# Patient Record
Sex: Female | Born: 1945 | Race: White | Hispanic: No | Marital: Single | Smoking: Never smoker
Health system: Southern US, Community
[De-identification: ages and names within clinical notes are randomized; demographics above are authoritative.]

## PROBLEM LIST (undated history)

## (undated) DIAGNOSIS — L308 Other specified dermatitis: Secondary | ICD-10-CM

## (undated) DIAGNOSIS — I6523 Occlusion and stenosis of bilateral carotid arteries: Secondary | ICD-10-CM

## (undated) DIAGNOSIS — E119 Type 2 diabetes mellitus without complications: Secondary | ICD-10-CM

## (undated) DIAGNOSIS — E7800 Pure hypercholesterolemia, unspecified: Secondary | ICD-10-CM

## (undated) DIAGNOSIS — R55 Syncope and collapse: Secondary | ICD-10-CM

## (undated) DIAGNOSIS — I10 Essential (primary) hypertension: Secondary | ICD-10-CM

## (undated) DIAGNOSIS — F419 Anxiety disorder, unspecified: Secondary | ICD-10-CM

## (undated) DIAGNOSIS — M810 Age-related osteoporosis without current pathological fracture: Secondary | ICD-10-CM

## (undated) DIAGNOSIS — L409 Psoriasis, unspecified: Secondary | ICD-10-CM

## (undated) DIAGNOSIS — M1990 Unspecified osteoarthritis, unspecified site: Secondary | ICD-10-CM

## (undated) DIAGNOSIS — I5189 Other ill-defined heart diseases: Secondary | ICD-10-CM

## (undated) DIAGNOSIS — M0579 Rheumatoid arthritis with rheumatoid factor of multiple sites without organ or systems involvement: Secondary | ICD-10-CM

## (undated) HISTORY — PX: FOOT SURGERY: SHX648

---

## 2004-02-06 ENCOUNTER — Encounter: Payer: Self-pay | Admitting: Family Medicine

## 2004-03-03 ENCOUNTER — Encounter: Payer: Self-pay | Admitting: Family Medicine

## 2004-04-03 ENCOUNTER — Encounter: Payer: Self-pay | Admitting: Family Medicine

## 2004-05-03 ENCOUNTER — Encounter: Payer: Self-pay | Admitting: Family Medicine

## 2004-06-03 ENCOUNTER — Encounter: Payer: Self-pay | Admitting: Family Medicine

## 2004-07-03 ENCOUNTER — Encounter: Payer: Self-pay | Admitting: Family Medicine

## 2004-08-03 ENCOUNTER — Encounter: Payer: Self-pay | Admitting: Family Medicine

## 2004-09-03 ENCOUNTER — Encounter: Payer: Self-pay | Admitting: Family Medicine

## 2004-10-03 ENCOUNTER — Encounter: Payer: Self-pay | Admitting: Family Medicine

## 2004-11-03 ENCOUNTER — Encounter: Payer: Self-pay | Admitting: Family Medicine

## 2004-12-03 ENCOUNTER — Encounter: Payer: Self-pay | Admitting: Family Medicine

## 2005-01-03 ENCOUNTER — Encounter: Payer: Self-pay | Admitting: Family Medicine

## 2005-02-03 ENCOUNTER — Encounter: Payer: Self-pay | Admitting: Family Medicine

## 2005-03-03 ENCOUNTER — Encounter: Payer: Self-pay | Admitting: Family Medicine

## 2005-04-03 ENCOUNTER — Encounter: Payer: Self-pay | Admitting: Family Medicine

## 2005-05-03 ENCOUNTER — Encounter: Payer: Self-pay | Admitting: Family Medicine

## 2006-05-15 ENCOUNTER — Ambulatory Visit: Payer: Self-pay | Admitting: Family Medicine

## 2006-05-24 ENCOUNTER — Ambulatory Visit: Payer: Self-pay | Admitting: Gastroenterology

## 2007-01-20 ENCOUNTER — Ambulatory Visit: Payer: Self-pay | Admitting: Internal Medicine

## 2007-04-29 ENCOUNTER — Ambulatory Visit: Payer: Self-pay | Admitting: Family Medicine

## 2007-04-29 ENCOUNTER — Other Ambulatory Visit: Payer: Self-pay

## 2007-04-29 ENCOUNTER — Observation Stay: Payer: Self-pay | Admitting: Internal Medicine

## 2008-10-09 ENCOUNTER — Ambulatory Visit: Payer: Self-pay | Admitting: Family Medicine

## 2010-03-11 ENCOUNTER — Ambulatory Visit: Payer: Self-pay | Admitting: Family Medicine

## 2010-11-12 ENCOUNTER — Ambulatory Visit: Payer: Self-pay

## 2011-11-29 ENCOUNTER — Ambulatory Visit: Payer: Self-pay

## 2011-12-12 ENCOUNTER — Ambulatory Visit: Payer: Self-pay | Admitting: Family Medicine

## 2012-01-03 ENCOUNTER — Ambulatory Visit: Payer: Self-pay | Admitting: Surgery

## 2012-01-03 HISTORY — PX: BREAST BIOPSY: SHX20

## 2012-01-05 LAB — PATHOLOGY REPORT

## 2012-01-10 ENCOUNTER — Ambulatory Visit: Payer: Self-pay | Admitting: Family Medicine

## 2012-08-01 ENCOUNTER — Ambulatory Visit: Payer: Self-pay | Admitting: Family Medicine

## 2013-07-19 IMAGING — MG MM ADDITIONAL VIEWS AT NO CHARGE
1 series · 3 of 3 positions shown · non-contrast
Comparison: none

REASON FOR EXAM: av lt asymmetric density
COMMENTS:

[L ML · left · 3 of 3 slices shown]
[im 1/3]
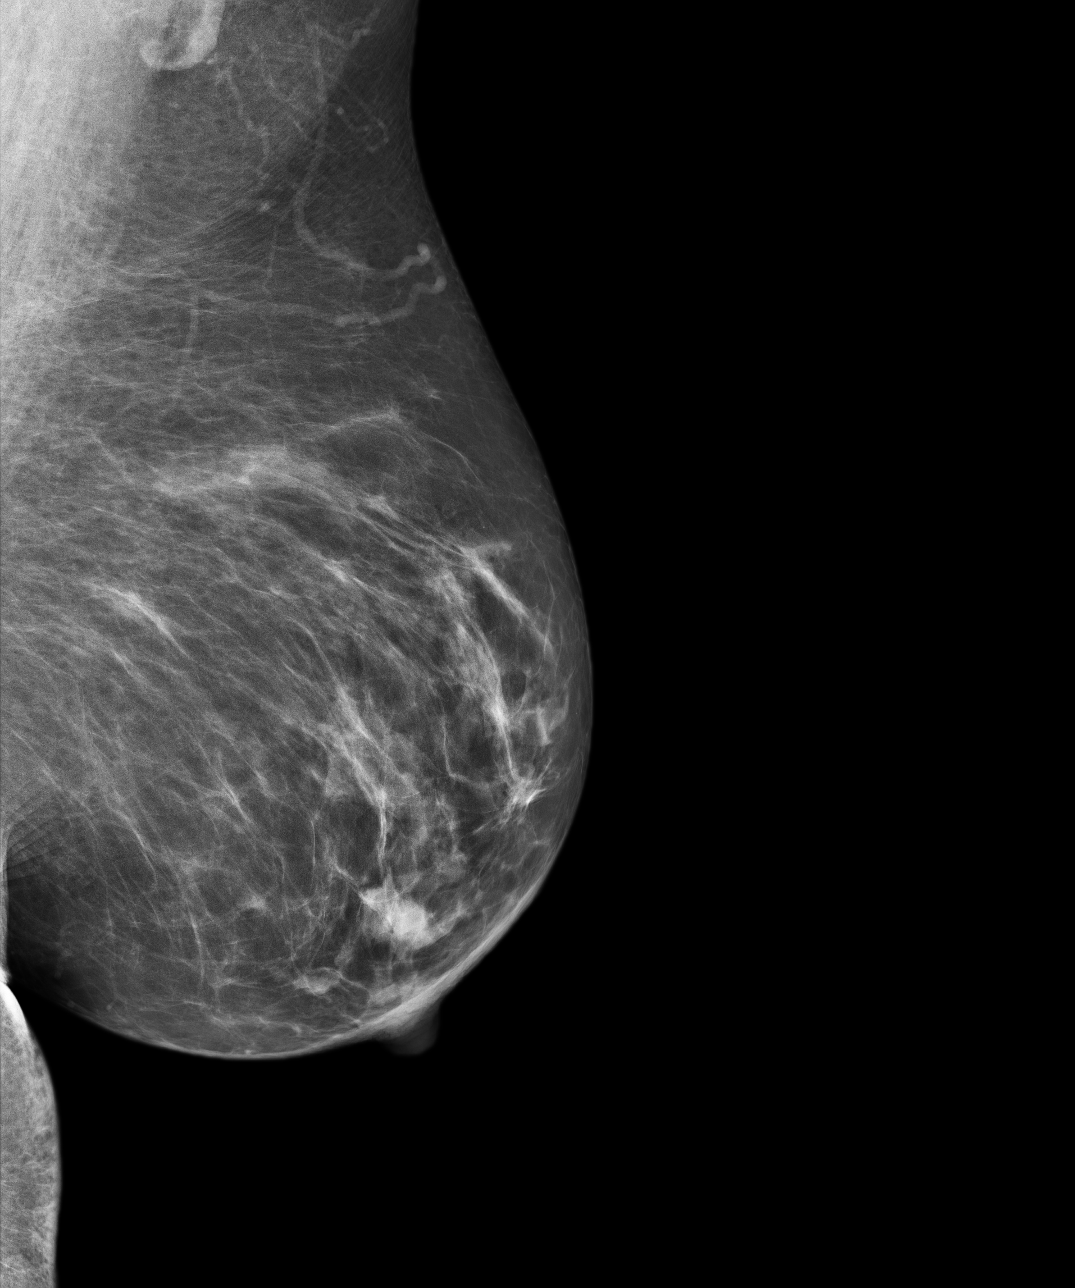
[im 2/3]
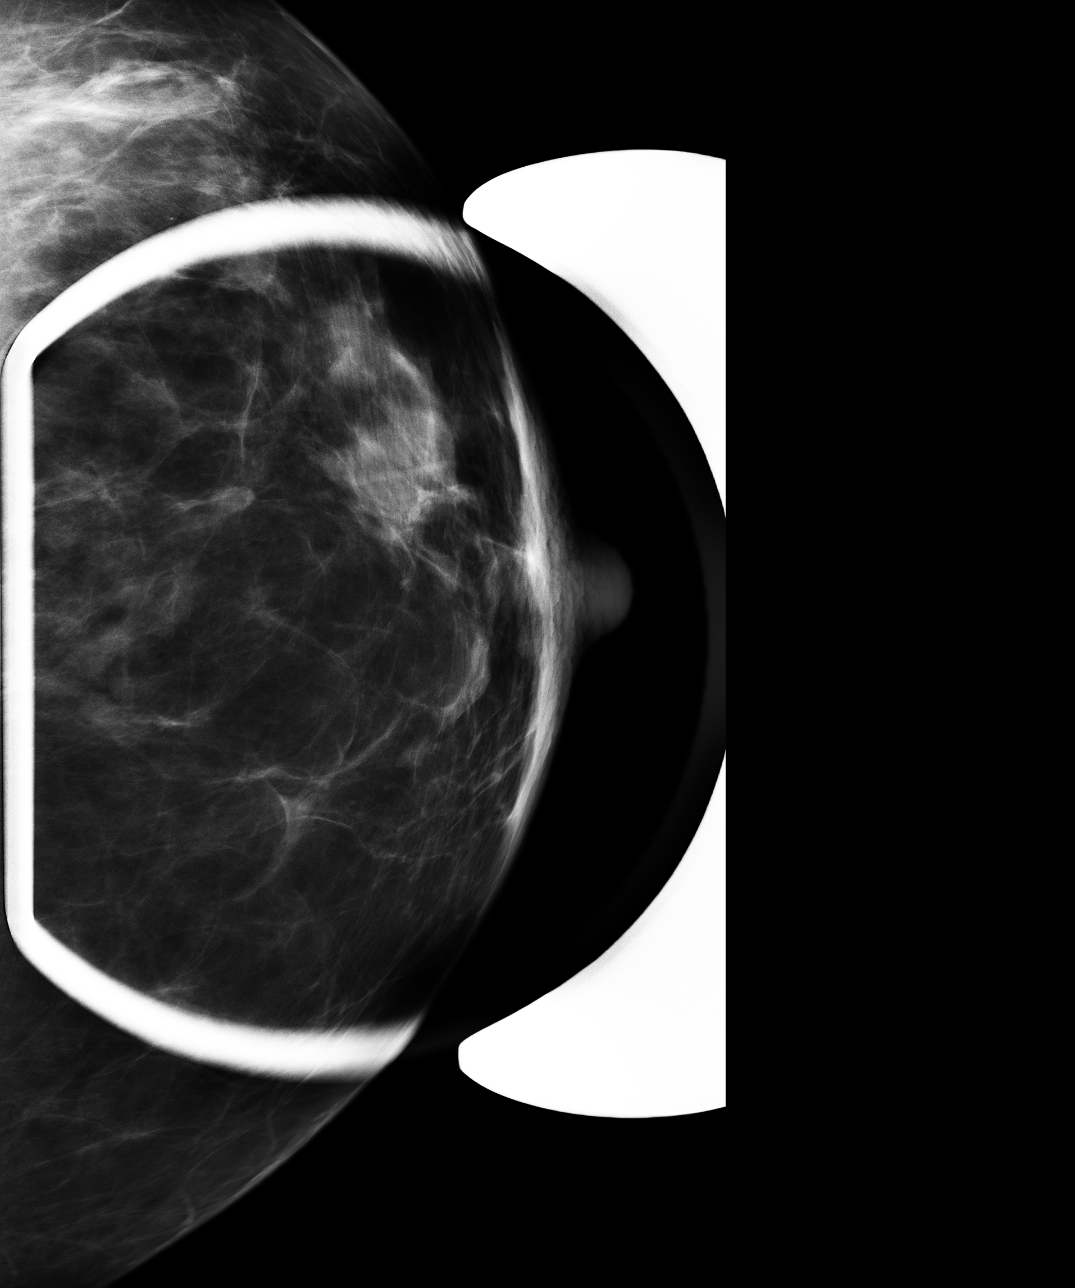
[im 3/3]
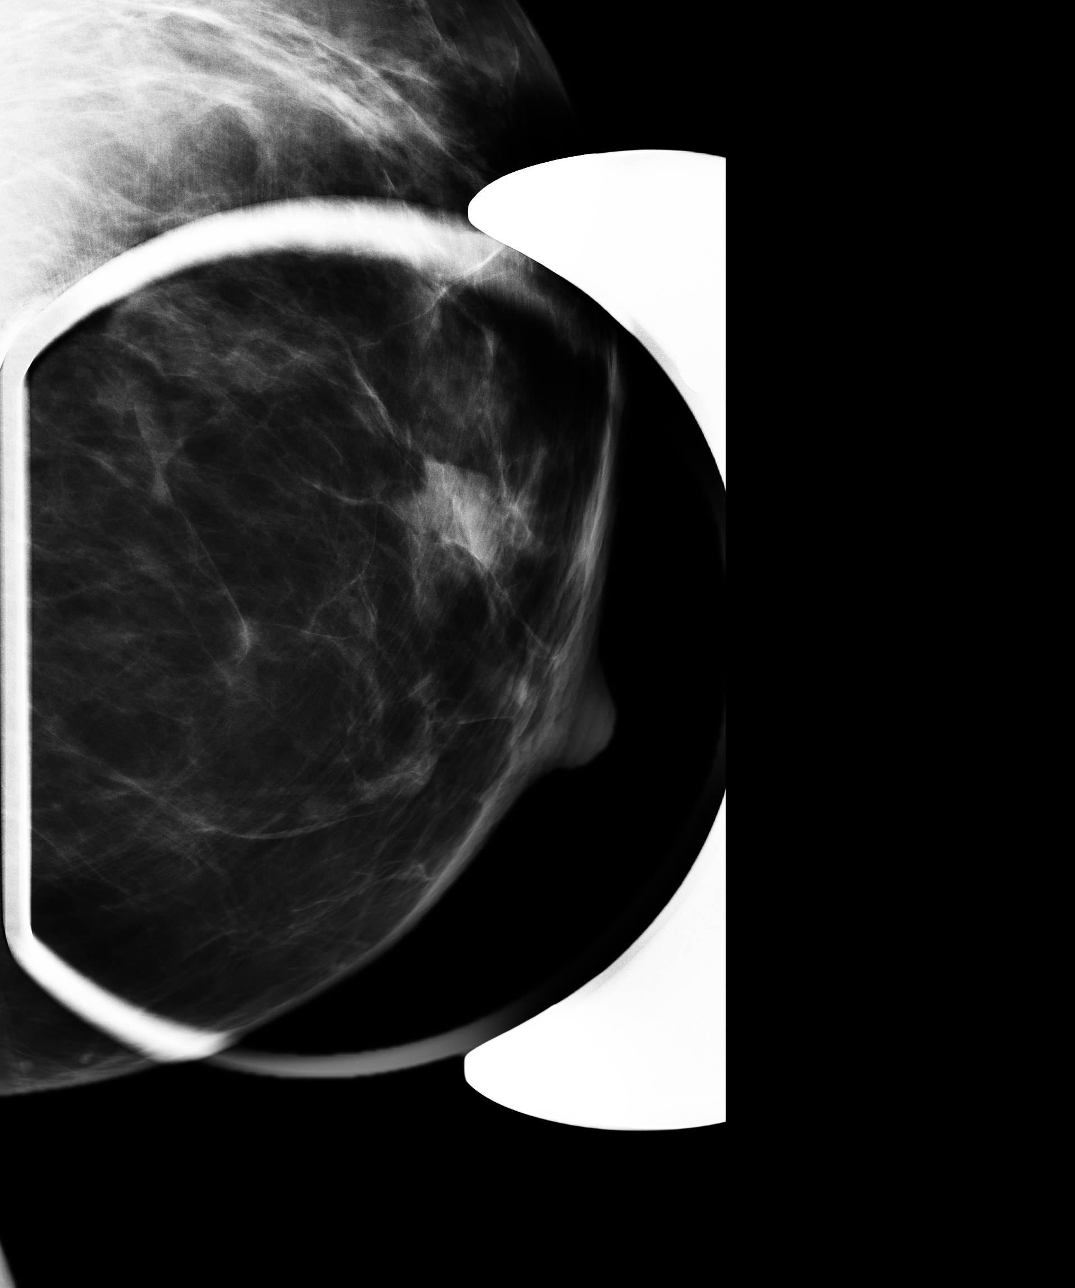

[3 of 3 positions shown; findings below may reference images not displayed]

PROCEDURE:     MAM - MAM DGTL ADD VW LT  SCR  - December 12, 2011  [DATE]

RESULT:

The previously described area of asymmetric density within the left breast
was further evaluated with magnification compression imaging. This area
demonstrates a component of effacement with compression though residual
density is appreciated. Sonographic evaluation of the periareolar region
demonstrates an oval-shaped indeterminate nodule at the [DATE] position
measuring 0.93 x 0.42 x 0.58 cm. This appears to correlate with a more
amorphous appearing nodule in the retroareolar area on the mammogram.
Further evaluation with surgical consultation is recommended. There is no
further radiographic evidence to suggest malignancy.
IMPRESSION: BI-RADS: Category 4 - Suspicious Abnormality

Thank you for this opportunity to contribute to the care of your patient.

A NEGATIVE MAMMOGRAM REPORT DOES NOT PRECLUDE BIOPSY OR OTHER EVALUATION OF
A CLINICALLY PALPABLE OR OTHERWISE SUSPICIOUS MASS OR LESION. BREAST CANCER
MAY NOT BE DETECTED BY MAMMOGRAPHY IN UP TO 10% OF CASES.

## 2013-10-10 ENCOUNTER — Ambulatory Visit: Payer: Self-pay | Admitting: Family Medicine

## 2014-12-10 ENCOUNTER — Other Ambulatory Visit: Payer: Self-pay | Admitting: Student

## 2014-12-10 DIAGNOSIS — M810 Age-related osteoporosis without current pathological fracture: Secondary | ICD-10-CM

## 2014-12-10 DIAGNOSIS — Z1239 Encounter for other screening for malignant neoplasm of breast: Secondary | ICD-10-CM

## 2015-02-08 ENCOUNTER — Emergency Department
Admission: EM | Admit: 2015-02-08 | Discharge: 2015-02-08 | Disposition: A | Discharge status: Home / Self Care | Payer: Medicare Other | Attending: Emergency Medicine | Admitting: Emergency Medicine

## 2015-02-08 ENCOUNTER — Encounter: Payer: Self-pay | Admitting: Emergency Medicine

## 2015-02-08 ENCOUNTER — Emergency Department: Payer: Medicare Other

## 2015-02-08 DIAGNOSIS — E860 Dehydration: Secondary | ICD-10-CM | POA: Diagnosis not present

## 2015-02-08 DIAGNOSIS — E119 Type 2 diabetes mellitus without complications: Secondary | ICD-10-CM | POA: Insufficient documentation

## 2015-02-08 DIAGNOSIS — R55 Syncope and collapse: Secondary | ICD-10-CM | POA: Diagnosis not present

## 2015-02-08 DIAGNOSIS — J069 Acute upper respiratory infection, unspecified: Secondary | ICD-10-CM | POA: Insufficient documentation

## 2015-02-08 DIAGNOSIS — I10 Essential (primary) hypertension: Secondary | ICD-10-CM | POA: Insufficient documentation

## 2015-02-08 HISTORY — DX: Type 2 diabetes mellitus without complications: E11.9

## 2015-02-08 HISTORY — DX: Essential (primary) hypertension: I10

## 2015-02-08 HISTORY — DX: Anxiety disorder, unspecified: F41.9

## 2015-02-08 LAB — BASIC METABOLIC PANEL
ANION GAP: 9 (ref 5–15)
BUN: 20 mg/dL (ref 6–20)
CO2: 28 mmol/L (ref 22–32)
Calcium: 8.6 mg/dL — ABNORMAL LOW (ref 8.9–10.3)
Chloride: 95 mmol/L — ABNORMAL LOW (ref 101–111)
Creatinine, Ser: 0.57 mg/dL (ref 0.44–1.00)
Glucose, Bld: 189 mg/dL — ABNORMAL HIGH (ref 65–99)
POTASSIUM: 3.3 mmol/L — AB (ref 3.5–5.1)
SODIUM: 132 mmol/L — AB (ref 135–145)

## 2015-02-08 LAB — URINALYSIS COMPLETE WITH MICROSCOPIC (ARMC ONLY)
Bacteria, UA: NONE SEEN
Bilirubin Urine: NEGATIVE
GLUCOSE, UA: NEGATIVE mg/dL
HGB URINE DIPSTICK: NEGATIVE
KETONES UR: NEGATIVE mg/dL
LEUKOCYTES UA: NEGATIVE
NITRITE: NEGATIVE
Protein, ur: NEGATIVE mg/dL
SPECIFIC GRAVITY, URINE: 1.014 (ref 1.005–1.030)
pH: 7 (ref 5.0–8.0)

## 2015-02-08 LAB — CBC
HEMATOCRIT: 42.1 % (ref 35.0–47.0)
Hemoglobin: 14.4 g/dL (ref 12.0–16.0)
MCH: 29.8 pg (ref 26.0–34.0)
MCHC: 34.2 g/dL (ref 32.0–36.0)
MCV: 87.1 fL (ref 80.0–100.0)
PLATELETS: 241 10*3/uL (ref 150–440)
RBC: 4.83 MIL/uL (ref 3.80–5.20)
RDW: 13.7 % (ref 11.5–14.5)
WBC: 8.3 10*3/uL (ref 3.6–11.0)

## 2015-02-08 MED ORDER — ONDANSETRON 4 MG PO TBDP: 4.0000 mg | ORAL_TABLET | Freq: Three times a day (TID) | ORAL | Status: DC | PRN

## 2015-02-08 MED ORDER — POTASSIUM CHLORIDE CRYS ER 20 MEQ PO TBCR
40.0000 meq | EXTENDED_RELEASE_TABLET | Freq: Once | ORAL | Status: AC
  Administered 2015-02-08: 40 meq via ORAL
  Filled 2015-02-08: qty 2

## 2015-02-08 MED ORDER — SODIUM CHLORIDE 0.9 % IV BOLUS (SEPSIS)
1000.0000 mL | Freq: Once | INTRAVENOUS | Status: AC
  Administered 2015-02-08: 1000 mL via INTRAVENOUS

## 2015-02-08 NOTE — ED Provider Notes (Signed)
[facility] Emergency Department Provider Note  ____________________________________________  Time seen: 2:00 PM  I have reviewed the triage vital signs and the nursing notes.   HISTORY  Chief Complaint Loss of Consciousness    HPI Kerry Jordan is a 70 y.o. female who complains of syncope today patient reports that she has been having a upper respiratory infection for the past 2 or 3 days with sore throat rhinorrhea and nonproductive cough and severe fatigue. She states that she has been staying mostly in bed for the past 3 days due to the fatigue, which is unlike her. Denies any fevers vomiting nausea or diarrhea. No pain anywhere at all.  Today, despite not feeling any better and generally having to stay in bed, she went to church. After sitting upright for a while she notes that she felt very hot and thought that she was doing lightheaded and going to pass out so [place] [facility] where she became very lightheaded and lowered herself to the ground for briefly passing out. She denies any pain before or after, no headache vision changes numbness [place] weakness chest pain abdominal pain or back pain. No shortness of breath. She did scratch her nose with her fingernail but otherwise denies any trauma.  She reports poor oral intake over the last 2-3 days due to loss of appetite. Denies nausea or vomiting   Past Medical History  Diagnosis Date  . Diabetes mellitus without complication ([place])   . Hypertension   . Anxiety      There are no active problems to display for this patient.    Past Surgical History  Procedure Laterality Date  . Foot surgery       Current Outpatient Rx  Name  Route  Sig  Dispense  Refill  . ondansetron (ZOFRAN ODT) 4 MG disintegrating tablet   Oral   Take 1 tablet (4 mg total) by mouth every 8 (eight) hours as needed for nausea or vomiting.   20 tablet   0      Allergies Sulfa antibiotics   No family  history on file.  Social History Social History  Substance Use Topics  . Smoking status: Never Smoker   . Smokeless tobacco: None  . Alcohol Use: Yes     Comment: occasional    Review of Systems  Constitutional:   No fever or chills. No weight changes Eyes:   No blurry vision or double vision.  ENT:   Positive sore throat. Cardiovascular:   No chest pain. Respiratory:   No dyspnea positive nonproductive cough. Gastrointestinal:   Negative for abdominal pain, vomiting and diarrhea.  No BRBPR or melena. Genitourinary:   Negative for dysuria, urinary retention, bloody urine, or difficulty urinating. Musculoskeletal:   Negative for back pain. No joint swelling or pain. Skin:   Negative for rash. Neurological:   Negative for headaches, focal weakness or numbness. Psychiatric:  No anxiety or depression.   Endocrine:  No hot/cold intolerance, changes in energy, or sleep difficulty.  10-point ROS otherwise negative.  ____________________________________________   PHYSICAL EXAM:  VITAL SIGNS: ED Triage Vitals  Enc Vitals Group     BP 02/08/15 1400 147/80 mmHg     Pulse Rate 02/08/15 1415 71     Resp --      Temp --      Temp src --      SpO2 02/08/15 1415 95 %     Weight --      Height --  Head Cir --      Peak Flow --      Pain Score --      Pain Loc --      Pain Edu? --      Excl. in [place]? --     Vital signs reviewed, nursing assessments reviewed.   Constitutional:   Alert and oriented. Well appearing and in no distress. Eyes:   No scleral icterus. No conjunctival pallor. PERRL. EOMI ENT   Head:   Normocephalic and atraumatic.   Nose:   No congestion/rhinnorhea. No septal hematoma   Mouth/Throat:   Dry mucous membranes, no pharyngeal erythema. No peritonsillar mass. No uvula shift.   Neck:   No stridor. No SubQ emphysema. No meningismus. Hematological/Lymphatic/Immunilogical:   No cervical lymphadenopathy. Cardiovascular:   RRR. Normal and  symmetric distal pulses are present in all extremities. No murmurs, rubs, or gallops. Respiratory:   Normal respiratory effort without tachypnea nor retractions. Breath sounds are clear and equal bilaterally. No wheezes/rales/rhonchi. Gastrointestinal:   Soft and nontender. No distention. There is no CVA tenderness.  No rebound, rigidity, or guarding. Genitourinary:   deferred Musculoskeletal:   Nontender with normal range of motion in all extremities. No joint effusions.  No lower extremity tenderness.  No edema. Neurologic:   Normal speech and language.  CN 2-10 normal. Motor grossly intact. No pronator drift.  Normal gait. No gross focal neurologic deficits are appreciated.  Skin:    Skin is warm, dry and intact. No rash noted.  No petechiae, purpura, or bullae. Psychiatric:   Mood and affect are normal. Speech and behavior are normal. Patient exhibits appropriate insight and judgment.  ____________________________________________    LABS (pertinent positives/negatives) (all labs ordered are listed, but only abnormal results are displayed) Labs Reviewed  BASIC METABOLIC PANEL - Abnormal; Notable for the following:    Sodium 132 (*)    Potassium 3.3 (*)    Chloride 95 (*)    Glucose, Bld 189 (*)    Calcium 8.6 (*)    All other components within normal limits  CBC  URINALYSIS COMPLETEWITH MICROSCOPIC ([facility] ONLY)  CBG MONITORING, ED   ____________________________________________   EKG  Interpreted by me Normal sinus rhythm rate of 68, normal axis and intervals. Normal QRS. Normal ST segments. T wave inversions in V2 and V3. No recent EKG available for comparison  ____________________________________________    RADIOLOGY  Chest x-ray interpreted by me, radiology report reviewed, essentially unremarkable. No acute pathology or airspace disease  ____________________________________________   PROCEDURES   ____________________________________________   INITIAL  IMPRESSION / ASSESSMENT AND PLAN / ED COURSE  Pertinent labs & imaging results that were available during my care of the patient were reviewed by me and considered in my medical decision making (see chart for details).  Patient presents with syncope which appears to be related to dehydration and acute viral illness. She is overall well-appearing no acute distress, comfortable and pleasant to interact with. We'll rehydrate her with IV fluids will checking some labs and a chest x-ray.  ----------------------------------------- 3:01 PM on 02/08/2015 -----------------------------------------  Workup unremarkable. Low suspicion for pneumonia sepsis or vascular phenomenon. We'll give Zofran when necessary. Potassium in the emergency department. Follow-up with primary care.     ____________________________________________   FINAL CLINICAL IMPRESSION(S) / ED DIAGNOSES  Final diagnoses:  Acute upper respiratory infection  Dehydration  Syncope, unspecified syncope type      Carrie Mew, MD 02/08/15 [phone]

## 2015-02-08 NOTE — ED Notes (Addendum)
Pt comes into the ED via EMS c/o syncopal episode that occurred at church this morning.  Patient has been fighting a head cold for a couple of days prior to this.  Denies hitting her head, NSR on monitor, 154 CBG, 80 HR, 170/100, no orthostatic changes per EMS.

## 2015-02-08 NOTE — Discharge Instructions (Signed)
Dehydration, Adult Dehydration is a condition in which you do not have enough fluid or water in your body. It happens when you take in less fluid than you lose. Vital organs such as the kidneys, brain, and heart cannot function without a proper amount of fluids. Any loss of fluids from the body can cause dehydration.  Dehydration can range from mild to severe. This condition should be treated right away to help prevent it from becoming severe. CAUSES  This condition may be caused by:  Vomiting.  Diarrhea.  Excessive sweating, such as when exercising in hot or humid weather.  Not drinking enough fluid during strenuous exercise or during an illness.  Excessive urine output.  Fever.  Certain medicines. RISK FACTORS This condition is more likely to develop in:  People who are taking certain medicines that cause the body to lose excess fluid (diuretics).   People who have a chronic illness, such as diabetes, that may increase urination.  Older adults.   People who live at high altitudes.   People who participate in endurance sports.  SYMPTOMS  Mild Dehydration  Thirst.  Dry lips.  Slightly dry mouth.  Dry, warm skin. Moderate Dehydration  Very dry mouth.   Muscle cramps.   Dark urine and decreased urine production.   Decreased tear production.   Headache.   Light-headedness, especially when you stand up from a sitting position.  Severe Dehydration  Changes in skin.   Cold and clammy skin.   Skin does not spring back quickly when lightly pinched and released.   Changes in body fluids.   Extreme thirst.   No tears.   Not able to sweat when body temperature is high, such as in hot weather.   Minimal urine production.   Changes in vital signs.   Rapid, weak pulse (more than 100 beats per minute when you are sitting still).   Rapid breathing.   Low blood pressure.   Other changes.   Sunken eyes.   Cold hands and feet.    Confusion.  Lethargy and difficulty being awakened.  Fainting (syncope).   Short-term weight loss.   Unconsciousness. DIAGNOSIS  This condition may be diagnosed based on your symptoms. You may also have tests to determine how severe your dehydration is. These tests may include:   Urine tests.   Blood tests.  TREATMENT  Treatment for this condition depends on the severity. Mild or moderate dehydration can often be treated at home. Treatment should be started right away. Do not wait until dehydration becomes severe. Severe dehydration needs to be treated at the hospital. Treatment for Mild Dehydration  Drinking plenty of water to replace the fluid you have lost.   Replacing minerals in your blood (electrolytes) that you may have lost.  Treatment for Moderate Dehydration  Consuming oral rehydration solution (ORS). Treatment for Severe Dehydration  Receiving fluid through an IV tube.   Receiving electrolyte solution through a feeding tube that is passed through your nose and into your stomach (nasogastric tube or NG tube).  Correcting any abnormalities in electrolytes. HOME CARE INSTRUCTIONS   Drink enough fluid to keep your urine clear or pale yellow.   Drink water or fluid slowly by taking small sips. You can also try sucking on ice cubes.  Have food or beverages that contain electrolytes. Examples include bananas and sports drinks.  Take over-the-counter and prescription medicines only as told by your health care provider.   Prepare ORS according to the manufacturer's instructions. Take sips  of ORS every 5 minutes until your urine returns to normal.  If you have vomiting or diarrhea, continue to try to drink water, ORS, or both.   If you have diarrhea, avoid:   Beverages that contain caffeine.   Fruit juice.   Milk.   Carbonated soft drinks.  Do not take salt tablets. This can lead to the condition of having too much sodium in your body  (hypernatremia).  SEEK MEDICAL CARE IF:  You cannot eat or drink without vomiting.  You have had moderate diarrhea during a period of more than 24 hours.  You have a fever. SEEK IMMEDIATE MEDICAL CARE IF:   You have extreme thirst.  You have severe diarrhea.  You have not urinated in 6-8 hours, or you have urinated only a small amount of very dark urine.  You have shriveled skin.  You are dizzy, confused, or both.   This information is not intended to replace advice given to you by your health care provider. Make sure you discuss any questions you have with your health care provider.   Document Released: 12/20/2004 Document Revised: 09/10/2014 Document Reviewed: 05/07/2014 Elsevier Interactive Patient Education 2016 [employer]  Upper Respiratory Infection, Adult Most upper respiratory infections (URIs) are a viral infection of the air passages leading to the lungs. A URI affects the nose, throat, and upper air passages. The most common type of URI is nasopharyngitis and is typically referred to as "the common cold." URIs run their course and usually go away on their own. Most of the time, a URI does not require medical attention, but sometimes a bacterial infection in the upper airways can follow a viral infection. This is called a secondary infection. Sinus and middle ear infections are common types of secondary upper respiratory infections. Bacterial pneumonia can also complicate a URI. A URI can worsen asthma and chronic obstructive pulmonary disease (COPD). Sometimes, these complications can require emergency medical care and may be life threatening.  CAUSES Almost all URIs are caused by viruses. A virus is a type of germ and can spread from one person to another.  RISKS FACTORS You may be at risk for a URI if:   You smoke.   You have chronic heart or lung disease.  You have a weakened defense (immune) system.   You are very young or very old.   You have nasal  allergies or asthma.  You work in crowded or poorly ventilated areas.  You work in health care facilities or schools. SIGNS AND SYMPTOMS  Symptoms typically develop 2-3 days after you come in contact with a cold virus. Most viral URIs last 7-10 days. However, viral URIs from the influenza virus (flu virus) can last 14-18 days and are typically more severe. Symptoms may include:   Runny or stuffy (congested) nose.   Sneezing.   Cough.   Sore throat.   Headache.   Fatigue.   Fever.   Loss of appetite.   Pain in your forehead, behind your eyes, and over your cheekbones (sinus pain).  Muscle aches.  DIAGNOSIS  Your health care provider may diagnose a URI by:  Physical exam.  Tests to check that your symptoms are not due to another condition such as:  Strep throat.  Sinusitis.  Pneumonia.  Asthma. TREATMENT  A URI goes away on its own with time. It cannot be cured with medicines, but medicines may be prescribed or recommended to relieve symptoms. Medicines may help:  Reduce your fever.  Reduce  your cough.  Relieve nasal congestion. HOME CARE INSTRUCTIONS   Take medicines only as directed by your health care provider.   Gargle warm saltwater or take cough drops to comfort your throat as directed by your health care provider.  Use a warm mist humidifier or inhale steam from a shower to increase air moisture. This may make it easier to breathe.  Drink enough fluid to keep your urine clear or pale yellow.   Eat soups and other clear broths and maintain good nutrition.   Rest as needed.   Return to work when your temperature has returned to normal or as your health care provider advises. You may need to stay home longer to avoid infecting others. You can also use a face mask and careful hand washing to prevent spread of the virus.  Increase the usage of your inhaler if you have asthma.   Do not use any tobacco products, including cigarettes,  chewing tobacco, or electronic cigarettes. If you need help quitting, ask your health care provider. PREVENTION  The best way to protect yourself from getting a cold is to practice good hygiene.   Avoid oral or hand contact with people with cold symptoms.   Wash your hands often if contact occurs.  There is no clear evidence that vitamin C, vitamin E, echinacea, or exercise reduces the chance of developing a cold. However, it is always recommended to get plenty of rest, exercise, and practice good nutrition.  SEEK MEDICAL CARE IF:   You are getting worse rather than better.   Your symptoms are not controlled by medicine.   You have chills.  You have worsening shortness of breath.  You have brown or red mucus.  You have yellow or brown nasal discharge.  You have pain in your face, especially when you bend forward.  You have a fever.  You have swollen neck glands.  You have pain while swallowing.  You have white areas in the back of your throat. SEEK IMMEDIATE MEDICAL CARE IF:   You have severe or persistent:  Headache.  Ear pain.  Sinus pain.  Chest pain.  You have chronic lung disease and any of the following:  Wheezing.  Prolonged cough.  Coughing up blood.  A change in your usual mucus.  You have a stiff neck.  You have changes in your:  Vision.  Hearing.  Thinking.  Mood. MAKE SURE YOU:   Understand these instructions.  Will watch your condition.  Will get help right away if you are not doing well or get worse.   This information is not intended to replace advice given to you by your health care provider. Make sure you discuss any questions you have with your health care provider.   Document Released: 06/15/2000 Document Revised: 05/06/2014 Document Reviewed: 03/27/2013 Elsevier Interactive Patient Education 2016 [employer].  Syncope Syncope means a person passes out (faints). The person usually wakes up in less than 5 minutes.  It is important to seek medical care for syncope. HOME CARE  Have someone stay with you until you feel normal.  Do not drive, use machines, or play sports until your doctor says it is okay.  Keep all doctor visits as told.  Lie down when you feel like you might pass out. Take deep breaths. Wait until you feel normal before standing up.  Drink enough fluids to keep your pee (urine) clear or pale yellow.  If you take blood pressure or heart medicine, get up slowly. Take  several minutes to sit and then stand. GET HELP RIGHT AWAY IF:   You have a severe headache.  You have pain in the chest, belly (abdomen), or back.  You are bleeding from the mouth or butt (rectum).  You have black or tarry poop (stool).  You have an irregular or very fast heartbeat.  You have pain with breathing.  You keep passing out, or you have shaking (seizures) when you pass out.  You pass out when sitting or lying down.  You feel confused.  You have trouble walking.  You have severe weakness.  You have vision problems. If you fainted, call for help (911 in [place] Do not drive yourself to the hospital.   This information is not intended to replace advice given to you by your health care provider. Make sure you discuss any questions you have with your health care provider.   Document Released: 06/08/2007 Document Revised: 05/06/2014 Document Reviewed: 02/18/2011 Elsevier Interactive Patient Education [employer].

## 2015-02-08 NOTE — ED Notes (Signed)
Patient transported to X-ray 

## 2015-02-10 ENCOUNTER — Ambulatory Visit
Admission: EM | Admit: 2015-02-10 | Discharge: 2015-02-10 | Discharge status: Absent without leave | Payer: Medicare Other

## 2015-04-30 ENCOUNTER — Ambulatory Visit
Admission: RE | Admit: 2015-04-30 | Discharge: 2015-04-30 | Disposition: A | Discharge status: Home / Self Care | Payer: Medicare Other | Source: Ambulatory Visit | Attending: Student | Admitting: Student

## 2015-04-30 DIAGNOSIS — Z1231 Encounter for screening mammogram for malignant neoplasm of breast: Secondary | ICD-10-CM | POA: Diagnosis present

## 2015-04-30 DIAGNOSIS — M810 Age-related osteoporosis without current pathological fracture: Secondary | ICD-10-CM | POA: Diagnosis not present

## 2015-04-30 DIAGNOSIS — Z1239 Encounter for other screening for malignant neoplasm of breast: Secondary | ICD-10-CM

## 2016-05-15 ENCOUNTER — Ambulatory Visit
Admission: EM | Admit: 2016-05-15 | Discharge: 2016-05-15 | Disposition: A | Discharge status: Home / Self Care | Payer: Medicare Other | Attending: Family Medicine | Admitting: Family Medicine

## 2016-05-15 DIAGNOSIS — J0100 Acute maxillary sinusitis, unspecified: Secondary | ICD-10-CM | POA: Diagnosis not present

## 2016-05-15 MED ORDER — AMOXICILLIN 875 MG PO TABS: 875.0000 mg | ORAL_TABLET | Freq: Two times a day (BID) | ORAL | 0 refills | Status: DC

## 2016-05-15 NOTE — ED Provider Notes (Signed)
[facility]    CSN: [id number] Arrival date & time: 05/15/16  0847     History   Chief Complaint Chief Complaint  Patient presents with  . Sinusitis    HPI Kerry Jordan is a 71 y.o. female.   The history is provided by the patient.  Sinusitis  Associated symptoms: congestion and cough   URI  Presenting symptoms: congestion, cough and facial pain   Severity:  Moderate Onset quality:  Sudden Duration:  6 days Timing:  Constant Progression:  Worsening Chronicity:  New Relieved by:  Nothing Worsened by:  Nothing Ineffective treatments:  OTC medications Associated symptoms: sinus pain   Risk factors: being elderly and diabetes mellitus   Risk factors: no chronic cardiac disease, no chronic kidney disease, no chronic respiratory disease, no immunosuppression, no recent illness and no recent travel     Past Medical History:  Diagnosis Date  . Anxiety   . Diabetes mellitus without complication ([place])   . Hypertension     There are no active problems to display for this patient.   Past Surgical History:  Procedure Laterality Date  . BREAST BIOPSY Left 01/03/12   [place] bx/clip-neg  . FOOT SURGERY      OB History    No data available       Home Medications    Prior to Admission medications   Medication Sig Start Date End Date Taking? Authorizing Provider  carvedilol (COREG) 6.25 MG tablet Take 6.25 mg by mouth 2 (two) times daily with a meal.   Yes [provider]  hydrochlorothiazide (HYDRODIURIL) 25 MG tablet Take 25 mg by mouth daily.   Yes [provider]  lisinopril (PRINIVIL,ZESTRIL) 2.5 MG tablet Take 2.5 mg by mouth daily.   Yes [provider]  lovastatin (MEVACOR) 40 MG tablet Take 40 mg by mouth at bedtime.   Yes [provider]  metFORMIN (GLUCOPHAGE) 500 MG tablet Take by mouth 2 (two) times daily with a meal.   Yes [provider]  amoxicillin (AMOXIL) 875 MG tablet Take 1 tablet (875  mg total) by mouth 2 (two) times daily. 05/15/16   Norval Gable, MD    Family History History reviewed. No pertinent family history.  Social History Social History  Substance Use Topics  . Smoking status: Never Smoker  . Smokeless tobacco: Never Used  . Alcohol use Yes     Comment: occasional     Allergies   Sulfa antibiotics   Review of Systems Review of Systems  HENT: Positive for congestion and sinus pain.   Respiratory: Positive for cough.      Physical Exam Triage Vital Signs ED Triage Vitals  Enc Vitals Group     BP 05/15/16 0904 (!) 151/53     Pulse Rate 05/15/16 0904 70     Resp 05/15/16 0904 18     Temp 05/15/16 0904 98 F (36.7 C)     Temp Source 05/15/16 0904 Oral     SpO2 05/15/16 0904 97 %     Weight 05/15/16 0904 125 lb (56.7 kg)     Height 05/15/16 0904 4\' 10"  (1.473 m)     Head Circumference --      Peak Flow --      Pain Score 05/15/16 0905 1     Pain Loc --      Pain Edu? --      Excl. in [place]? --    No data found.   Updated Vital  Signs BP (!) 151/53 (BP Location: Left Arm)   Pulse 70   Temp 98 F (36.7 C) (Oral)   Resp 18   Ht 4\' 10"  (1.473 m)   Wt 125 lb (56.7 kg)   SpO2 97%   BMI 26.13 kg/m   Visual Acuity Right Eye Distance:   Left Eye Distance:   Bilateral Distance:    Right Eye Near:   Left Eye Near:    Bilateral Near:     Physical Exam  Constitutional: She appears well-developed and well-nourished. No distress.  HENT:  Head: Normocephalic and atraumatic.  Right Ear: Tympanic membrane, external ear and ear canal normal.  Left Ear: Tympanic membrane, external ear and ear canal normal.  Nose: Mucosal edema and rhinorrhea present. No nose lacerations, sinus tenderness, nasal deformity, septal deviation or nasal septal hematoma. No epistaxis.  No foreign bodies. Right sinus exhibits maxillary sinus tenderness and frontal sinus tenderness. Left sinus exhibits maxillary sinus tenderness and frontal sinus tenderness.    Mouth/Throat: Uvula is midline, oropharynx is clear and moist and mucous membranes are normal. No oropharyngeal exudate.  Eyes: Conjunctivae and EOM are normal. Pupils are equal, round, and reactive to light. Right eye exhibits no discharge. Left eye exhibits no discharge. No scleral icterus.  Neck: Normal range of motion. Neck supple. No thyromegaly present.  Cardiovascular: Normal rate, regular rhythm and normal heart sounds.   Pulmonary/Chest: Effort normal and breath sounds normal. No respiratory distress. She has no wheezes. She has no rales.  Lymphadenopathy:    She has no cervical adenopathy.  Skin: She is not diaphoretic.  Nursing note and vitals reviewed.    UC Treatments / Results  Labs (all labs ordered are listed, but only abnormal results are displayed) Labs Reviewed - No data to display  EKG  EKG Interpretation None       Radiology No results found.  Procedures Procedures (including critical care time)  Medications Ordered in UC Medications - No data to display   Initial Impression / Assessment and Plan / UC Course  I have reviewed the triage vital signs and the nursing notes.  Pertinent labs & imaging results that were available during my care of the patient were reviewed by me and considered in my medical decision making (see chart for details).       Final Clinical Impressions(s) / UC Diagnoses   Final diagnoses:  Acute maxillary sinusitis, recurrence not specified    New Prescriptions Discharge Medication List as of 05/15/2016  9:26 AM    START taking these medications   Details  amoxicillin (AMOXIL) 875 MG tablet Take 1 tablet (875 mg total) by mouth 2 (two) times daily., Starting Sun 05/15/2016, Normal       1. diagnosis reviewed with patient 2. rx as per orders above; reviewed possible side effects, interactions, risks and benefits  3. Recommend supportive treatment with otc flonase 4. Follow-up prn if symptoms worsen or don't  improve   Norval Gable, MD 05/15/16 1142

## 2016-05-15 NOTE — ED Triage Notes (Signed)
Pt c/o sinus pressure, congestion for the last 4 days. Cough keeping her up at night.

## 2016-05-17 ENCOUNTER — Emergency Department
Admission: EM | Admit: 2016-05-17 | Discharge: 2016-05-17 | Disposition: A | Discharge status: Home / Self Care | Payer: Medicare Other | Attending: Emergency Medicine | Admitting: Emergency Medicine

## 2016-05-17 ENCOUNTER — Emergency Department: Payer: Medicare Other

## 2016-05-17 ENCOUNTER — Encounter: Payer: Self-pay | Admitting: Emergency Medicine

## 2016-05-17 DIAGNOSIS — I10 Essential (primary) hypertension: Secondary | ICD-10-CM | POA: Diagnosis not present

## 2016-05-17 DIAGNOSIS — E119 Type 2 diabetes mellitus without complications: Secondary | ICD-10-CM | POA: Insufficient documentation

## 2016-05-17 DIAGNOSIS — Z79899 Other long term (current) drug therapy: Secondary | ICD-10-CM | POA: Diagnosis not present

## 2016-05-17 DIAGNOSIS — Z7984 Long term (current) use of oral hypoglycemic drugs: Secondary | ICD-10-CM | POA: Insufficient documentation

## 2016-05-17 DIAGNOSIS — R55 Syncope and collapse: Secondary | ICD-10-CM | POA: Diagnosis present

## 2016-05-17 DIAGNOSIS — J069 Acute upper respiratory infection, unspecified: Secondary | ICD-10-CM | POA: Diagnosis not present

## 2016-05-17 DIAGNOSIS — B9789 Other viral agents as the cause of diseases classified elsewhere: Secondary | ICD-10-CM

## 2016-05-17 LAB — BASIC METABOLIC PANEL
ANION GAP: 9 (ref 5–15)
BUN: 17 mg/dL (ref 6–20)
CALCIUM: 9 mg/dL (ref 8.9–10.3)
CHLORIDE: 95 mmol/L — AB (ref 101–111)
CO2: 27 mmol/L (ref 22–32)
CREATININE: 0.69 mg/dL (ref 0.44–1.00)
GFR calc Af Amer: >60 mL/min (ref >60)
GFR calc non Af Amer: >60 mL/min (ref >60)
GLUCOSE: 150 mg/dL — AB (ref 65–99)
Potassium: 3.7 mmol/L (ref 3.5–5.1)
Sodium: 131 mmol/L — ABNORMAL LOW (ref 135–145)

## 2016-05-17 LAB — URINALYSIS, COMPLETE (UACMP) WITH MICROSCOPIC
Bacteria, UA: NONE SEEN
Bilirubin Urine: NEGATIVE
GLUCOSE, UA: NEGATIVE mg/dL
Ketones, ur: NEGATIVE mg/dL
Leukocytes, UA: NEGATIVE
Nitrite: NEGATIVE
PH: 6 (ref 5.0–8.0)
Protein, ur: NEGATIVE mg/dL
RBC / HPF: NONE SEEN RBC/hpf (ref 0–5)
SPECIFIC GRAVITY, URINE: 1.002 — AB (ref 1.005–1.030)
SQUAMOUS EPITHELIAL / LPF: NONE SEEN
WBC, UA: NONE SEEN WBC/hpf (ref 0–5)

## 2016-05-17 LAB — CBC
HCT: 41.8 % (ref 35.0–47.0)
HEMOGLOBIN: 14.3 g/dL (ref 12.0–16.0)
MCH: 29.8 pg (ref 26.0–34.0)
MCHC: 34.4 g/dL (ref 32.0–36.0)
MCV: 86.8 fL (ref 80.0–100.0)
PLATELETS: 230 10*3/uL (ref 150–440)
RBC: 4.81 MIL/uL (ref 3.80–5.20)
RDW: 14 % (ref 11.5–14.5)
WBC: 9.3 10*3/uL (ref 3.6–11.0)

## 2016-05-17 LAB — TROPONIN I: Troponin I: <0.03 ng/mL (ref <0.03)

## 2016-05-17 LAB — GLUCOSE, CAPILLARY: Glucose-Capillary: 141 mg/dL — ABNORMAL HIGH (ref 65–99)

## 2016-05-17 MED ORDER — ONDANSETRON HCL 4 MG/2ML IJ SOLN
4.0000 mg | Freq: Once | INTRAMUSCULAR | Status: AC
  Administered 2016-05-17: 4 mg via INTRAVENOUS
  Filled 2016-05-17: qty 2

## 2016-05-17 MED ORDER — ACETAMINOPHEN 500 MG PO TABS
1000.0000 mg | ORAL_TABLET | Freq: Once | ORAL | Status: AC
  Administered 2016-05-17: 1000 mg via ORAL
  Filled 2016-05-17: qty 2

## 2016-05-17 MED ORDER — SODIUM CHLORIDE 0.9 % IV BOLUS (SEPSIS)
1000.0000 mL | Freq: Once | INTRAVENOUS | Status: AC
  Administered 2016-05-17: 1000 mL via INTRAVENOUS

## 2016-05-17 NOTE — ED Provider Notes (Signed)
[facility] Emergency Department Provider Note  ____________________________________________  Time seen: Approximately 7:39 AM  I have reviewed the triage vital signs and the nursing notes.   HISTORY  Chief Complaint Loss of Consciousness   HPI Kerry Jordan is a 71 y.o. female with a history of anxiety, diabetes, hypertension who presents for evaluation of a syncopal episode. Patient reports that she has been sick with upper respiratory infection for the last 3 days. She has had cough, congestion, sinus infection, and nausea. She was seen at urgent care yesterday and was given amoxicillin for sinusitis. She reports that she felt unwell yesterday very fatigued and weak. She was sitting on the couch and she started to feel dizzy like she was going to pass out. She got up and went to unlock the door in case she needed to call 911. She reports that she felt more lightheaded. She walked into her bathroom and reports that she passed out. She fell on the ground and hit the frontal part of her head onto the floor. She didn't call her brother who came and picked her up and brought her to the emergency room. She denies headache, changes in vision, vomiting. She does endorse body aches and chills. No chest pain, palpitations, shortness of breath, leg swelling, abdominal pain. She denies neck pain, back pain. She has h/o syncopal episodes when she gets sick and dehydrated. She also started to have diarrhea this morning.  Past Medical History:  Diagnosis Date  . Anxiety   . Diabetes mellitus without complication ([place])   . Hypertension     There are no active problems to display for this patient.   Past Surgical History:  Procedure Laterality Date  . BREAST BIOPSY Left 01/03/12   [place] bx/clip-neg  . FOOT SURGERY      Prior to Admission medications   Medication Sig Start Date End Date Taking? Authorizing Provider  amoxicillin (AMOXIL) 875 MG tablet Take 1 tablet  (875 mg total) by mouth 2 (two) times daily. 05/15/16   Norval Gable, MD  carvedilol (COREG) 6.25 MG tablet Take 6.25 mg by mouth 2 (two) times daily with a meal.    [provider]  hydrochlorothiazide (HYDRODIURIL) 25 MG tablet Take 25 mg by mouth daily.    [provider]  lisinopril (PRINIVIL,ZESTRIL) 2.5 MG tablet Take 2.5 mg by mouth daily.    [provider]  lovastatin (MEVACOR) 40 MG tablet Take 40 mg by mouth at bedtime.    [provider]  metFORMIN (GLUCOPHAGE) 500 MG tablet Take by mouth 2 (two) times daily with a meal.    [provider]    Allergies Sulfa antibiotics  No family history on file.  Social History Social History  Substance Use Topics  . Smoking status: Never Smoker  . Smokeless tobacco: Never Used  . Alcohol use Yes     Comment: occasional    Review of Systems  Constitutional: Negative for fever. +chills, syncope Eyes: Negative for visual changes. ENT: Negative for sore throat. + congestion Neck: No neck pain  Cardiovascular: Negative for chest pain. Respiratory: Negative for shortness of breath. + cough Gastrointestinal: Negative for abdominal pain, vomiting or diarrhea. + nausea Genitourinary: Negative for dysuria. Musculoskeletal: Negative for back pain. Skin: Negative for rash. Neurological: Negative for headaches, weakness or numbness. Psych: No SI or HI  ____________________________________________   PHYSICAL EXAM:  VITAL SIGNS: ED Triage Vitals [05/17/16 0226]  Enc Vitals Group     BP Marland Kitchen)  148/59     Pulse Rate 71     Resp 18     Temp 98.4 F (36.9 C)     Temp Source Oral     SpO2 98 %     Weight 125 lb (56.7 kg)     Height 4\' 10"  (1.473 m)     Head Circumference      Peak Flow      Pain Score      Pain Loc      Pain Edu?      Excl. in [place]?    Constitutional: Alert and oriented. No acute distress. Does not appear intoxicated. HEENT Head: Normocephalic and atraumatic. Face: No  facial bony tenderness. Stable midface Ears: No hemotympanum bilaterally. No Battle sign. TMs Visualized bilaterally and clear Eyes: No eye injury. PERRL. No raccoon eyes Nose: Nontender. No epistaxis. No rhinorrhea Mouth/Throat: Mucous membranes are moist. No oropharyngeal blood. No dental injury. Airway patent without stridor. Normal voice. Oropharynx is clear with no swelling or exudates Neck: no C-collar in place. No midline c-spine tenderness.  Cardiovascular: Normal rate, regular rhythm. Normal and symmetric distal pulses are present in all extremities. Pulmonary/Chest: Normal respiratory effort. Breath sounds are normal with no crackles or wheezes Abdominal: Soft, nontender, non distended. Musculoskeletal: Nontender with normal full range of motion in all extremities. No deformities. No thoracic or lumbar midline spinal tenderness. Pelvis is stable. Skin: Skin is warm, dry and intact. No abrasions or contutions. Psychiatric: Speech and behavior are appropriate. Neurological: Normal speech and language. Normal strength and sensation, no pronator drift, no dysmetria. No focal neurological deficits   Glascow Coma Score: 4 - Opens eyes on own 6 - Follows simple motor commands 5 - Alert and oriented GCS: 15   ____________________________________________   LABS (all labs ordered are listed, but only abnormal results are displayed)  Labs Reviewed  BASIC METABOLIC PANEL - Abnormal; Notable for the following:       Result Value   Sodium 131 (*)    Chloride 95 (*)    Glucose, Bld 150 (*)    All other components within normal limits  URINALYSIS, COMPLETE (UACMP) WITH MICROSCOPIC - Abnormal; Notable for the following:    Color, Urine COLORLESS (*)    APPearance CLEAR (*)    Specific Gravity, Urine 1.002 (*)    Hgb urine dipstick SMALL (*)    All other components within normal limits  GLUCOSE, CAPILLARY - Abnormal; Notable for the following:    Glucose-Capillary 141 (*)    All  other components within normal limits  CBC  TROPONIN I  CBG MONITORING, ED   ____________________________________________  EKG  ED ECG REPORT I, Rudene Re, the attending physician, personally viewed and interpreted this ECG.  Normal sinus rhythm, rate of 70, normal intervals, normal axis, no ST elevations or depressions, diffuse T-wave flattening in all leads. No significant changes when compared to prior from 2009 ____________________________________________  RADIOLOGY  CXR: negative  ____________________________________________   PROCEDURES  Procedure(s) performed: None Procedures Critical Care performed:  None ____________________________________________   INITIAL IMPRESSION / ASSESSMENT AND PLAN / ED COURSE  71 y.o. female with a history of anxiety, diabetes, hypertension who presents for evaluation of a syncopal episode in the setting of a URI (cough, congestion, sinus infection, diarrhea) and possible dehydration. Orthostatic vital signs are negative. Patient is well-appearing, in no distress, afebrile with normal vital signs. No injuries identified from her syncopal episode on exam. Neuro intact, not on blood thinners, no pain  or trauma noticed on the head. Offered head CT, patient refused. EKG and trop negative. Labs with no acute findings. Will hydrate, give zofran, tylenol for body aches and monitor on telemetry.   Clinical Course as of May 18 1103  Tue May 17, 2016  1104 Patient feels markedly improved, tolerating by mouth, no signs of arrhythmia on telemetry, blood work with no acute findings. Patient scheduled to be discharged home on supportive care follow-up with PCP.  [CV]    Clinical Course User Index [CV] Rudene Re, MD    Pertinent labs & imaging results that were available during my care of the patient were reviewed by me and considered in my medical decision making (see chart for  details).    ____________________________________________   FINAL CLINICAL IMPRESSION(S) / ED DIAGNOSES  Final diagnoses:  Syncope, unspecified syncope type  Viral URI with cough      NEW MEDICATIONS STARTED DURING THIS VISIT:  New Prescriptions   No medications on file     Note:  This document was prepared using Dragon voice recognition software and may include unintentional dictation errors.    Alfred Levins, [place], MD 05/17/16 [phone]

## 2016-05-17 NOTE — ED Notes (Signed)
ED Provider at bedside. 

## 2016-05-17 NOTE — ED Notes (Signed)
Pt alert and oriented X4, active, cooperative, pt in NAD. RR even and unlabored, color WNL.  Pt informed to return if any life threatening symptoms occur.   

## 2016-05-17 NOTE — Discharge Instructions (Signed)
You have been seen today in the Emergency Department (ED)  for syncope (passing out).  Your workup including labs and EKG did not show a cause for your syncope and were overall reassuring.   ° °Your symptoms may be due to dehydration, so it is important that you drink plenty of non-alcoholic fluids. Emotional stress, pain, or overheating--especially if you have been standing--can make you faint. In these cases, fainting is usually not serious. But fainting can be a sign of a more serious problem. Therefore it is imperative that you follow up with your doctor in 1-2 days for further evaluation. ° °When should you call for help?  °Call 911 anytime you think you may need emergency care. For example, call if:  °You have symptoms of a heart problem. These may include:  °Chest pain or pressure.  °Severe trouble breathing.  °A fast or irregular heartbeat.  °Lightheadedness or sudden weakness.  °Coughing up pink, foamy mucus.  °Passing out. °You have symptoms of a stroke. These may include:  °Sudden numbness, tingling, weakness, or loss of movement in your face, arm, or leg, especially on only one side of your body.  °Sudden vision changes.  °Sudden trouble speaking.  °Sudden confusion or trouble understanding simple statements.  °Sudden problems with walking or balance.  °A sudden, severe headache that is different from past headaches. °You passed out (lost consciousness) again. ° °Watch closely for changes in your health, and be sure to contact your doctor if:  °You do not get better as expected. ° ° How can you care for yourself at home?  °Drink plenty of fluids to prevent dehydration. If you have kidney, heart, or liver disease and have to limit fluids, talk with your doctor before you increase your fluid intake. ° ° °

## 2016-05-17 NOTE — ED Triage Notes (Signed)
Pt to triage via w/c with no distress noted; Seen at [facility] yesterday, dx with sinus infection and rx amoxi; st PTA was going from sofa to bed and had dizziness with syncopal episode; c/o persistent weakness

## 2016-05-17 NOTE — ED Notes (Signed)
Assisted the patient to the restroom. She ambulated great.

## 2016-05-26 ENCOUNTER — Other Ambulatory Visit: Payer: Self-pay | Admitting: Student

## 2016-05-26 DIAGNOSIS — Z1231 Encounter for screening mammogram for malignant neoplasm of breast: Secondary | ICD-10-CM

## 2016-06-01 ENCOUNTER — Ambulatory Visit
Admission: RE | Admit: 2016-06-01 | Discharge: 2016-06-01 | Disposition: A | Discharge status: Home / Self Care | Payer: Medicare Other | Source: Ambulatory Visit | Attending: Student | Admitting: Student

## 2016-06-01 DIAGNOSIS — Z1231 Encounter for screening mammogram for malignant neoplasm of breast: Secondary | ICD-10-CM | POA: Insufficient documentation

## 2016-06-10 ENCOUNTER — Other Ambulatory Visit: Payer: Self-pay | Admitting: Physician Assistant

## 2016-06-10 DIAGNOSIS — M810 Age-related osteoporosis without current pathological fracture: Secondary | ICD-10-CM

## 2016-06-10 DIAGNOSIS — N959 Unspecified menopausal and perimenopausal disorder: Secondary | ICD-10-CM

## 2016-06-28 ENCOUNTER — Ambulatory Visit
Admission: RE | Admit: 2016-06-28 | Discharge: 2016-06-28 | Disposition: A | Discharge status: Home / Self Care | Payer: Medicare Other | Source: Ambulatory Visit | Attending: Physician Assistant | Admitting: Physician Assistant

## 2016-06-28 DIAGNOSIS — M810 Age-related osteoporosis without current pathological fracture: Secondary | ICD-10-CM

## 2016-06-28 DIAGNOSIS — N959 Unspecified menopausal and perimenopausal disorder: Secondary | ICD-10-CM

## 2016-08-03 DIAGNOSIS — Z1211 Encounter for screening for malignant neoplasm of colon: Secondary | ICD-10-CM | POA: Insufficient documentation

## 2017-03-24 ENCOUNTER — Encounter: Payer: Self-pay | Admitting: *Deleted

## 2017-03-27 ENCOUNTER — Ambulatory Visit: Admission: RE | Admit: 2017-03-27 | Payer: Medicare Other | Source: Ambulatory Visit | Admitting: Gastroenterology

## 2017-03-27 ENCOUNTER — Ambulatory Visit
Admission: RE | Admit: 2017-03-27 | Discharge: 2017-03-27 | Disposition: A | Discharge status: Home / Self Care | Payer: Medicare Other | Source: Ambulatory Visit | Attending: Gastroenterology | Admitting: Gastroenterology

## 2017-03-27 ENCOUNTER — Encounter
Admission: RE | Disposition: A | Discharge status: Home / Self Care | Payer: Self-pay | Source: Ambulatory Visit | Attending: Gastroenterology

## 2017-03-27 ENCOUNTER — Encounter: Admission: RE | Payer: Self-pay | Source: Ambulatory Visit

## 2017-03-27 ENCOUNTER — Encounter: Payer: Self-pay | Admitting: Certified Registered Nurse Anesthetist

## 2017-03-27 ENCOUNTER — Ambulatory Visit: Payer: Medicare Other | Admitting: Certified Registered Nurse Anesthetist

## 2017-03-27 DIAGNOSIS — F419 Anxiety disorder, unspecified: Secondary | ICD-10-CM | POA: Diagnosis not present

## 2017-03-27 DIAGNOSIS — K644 Residual hemorrhoidal skin tags: Secondary | ICD-10-CM | POA: Insufficient documentation

## 2017-03-27 DIAGNOSIS — E119 Type 2 diabetes mellitus without complications: Secondary | ICD-10-CM | POA: Diagnosis not present

## 2017-03-27 DIAGNOSIS — Z882 Allergy status to sulfonamides status: Secondary | ICD-10-CM | POA: Diagnosis not present

## 2017-03-27 DIAGNOSIS — Z79899 Other long term (current) drug therapy: Secondary | ICD-10-CM | POA: Diagnosis not present

## 2017-03-27 DIAGNOSIS — K635 Polyp of colon: Secondary | ICD-10-CM | POA: Diagnosis not present

## 2017-03-27 DIAGNOSIS — I10 Essential (primary) hypertension: Secondary | ICD-10-CM | POA: Insufficient documentation

## 2017-03-27 DIAGNOSIS — Z7984 Long term (current) use of oral hypoglycemic drugs: Secondary | ICD-10-CM | POA: Diagnosis not present

## 2017-03-27 DIAGNOSIS — K621 Rectal polyp: Secondary | ICD-10-CM | POA: Diagnosis not present

## 2017-03-27 DIAGNOSIS — E7800 Pure hypercholesterolemia, unspecified: Secondary | ICD-10-CM | POA: Insufficient documentation

## 2017-03-27 DIAGNOSIS — K5730 Diverticulosis of large intestine without perforation or abscess without bleeding: Secondary | ICD-10-CM | POA: Diagnosis not present

## 2017-03-27 DIAGNOSIS — Z1211 Encounter for screening for malignant neoplasm of colon: Secondary | ICD-10-CM | POA: Insufficient documentation

## 2017-03-27 HISTORY — PX: COLONOSCOPY WITH PROPOFOL: SHX5780

## 2017-03-27 HISTORY — DX: Pure hypercholesterolemia, unspecified: E78.00

## 2017-03-27 LAB — GLUCOSE, CAPILLARY: Glucose-Capillary: 141 mg/dL — ABNORMAL HIGH (ref 65–99)

## 2017-03-27 SURGERY — COLONOSCOPY WITH PROPOFOL
Anesthesia: General

## 2017-03-27 MED ORDER — LIDOCAINE HCL (CARDIAC) 20 MG/ML IV SOLN
INTRAVENOUS | Status: DC | PRN
  Administered 2017-03-27: 50 mg via INTRAVENOUS

## 2017-03-27 MED ORDER — SODIUM CHLORIDE 0.9 % IV SOLN
INTRAVENOUS | Status: DC
  Administered 2017-03-27: 1000 mL via INTRAVENOUS

## 2017-03-27 MED ORDER — LIDOCAINE HCL (PF) 2 % IJ SOLN
INTRAMUSCULAR | Status: AC
Start: 2017-03-27 — End: ?
  Filled 2017-03-27: qty 10

## 2017-03-27 MED ORDER — PROPOFOL 500 MG/50ML IV EMUL
INTRAVENOUS | Status: DC | PRN
  Administered 2017-03-27: 130 ug/kg/min via INTRAVENOUS

## 2017-03-27 MED ORDER — SODIUM CHLORIDE 0.9 % IV SOLN: INTRAVENOUS | Status: DC

## 2017-03-27 MED ORDER — PROPOFOL 10 MG/ML IV BOLUS
INTRAVENOUS | Status: DC | PRN
  Administered 2017-03-27: 100 mg via INTRAVENOUS

## 2017-03-27 MED ORDER — PROPOFOL 500 MG/50ML IV EMUL
INTRAVENOUS | Status: AC
  Filled 2017-03-27: qty 50

## 2017-03-27 MED ORDER — PHENYLEPHRINE HCL 10 MG/ML IJ SOLN
INTRAMUSCULAR | Status: DC | PRN
  Administered 2017-03-27: 200 ug via INTRAVENOUS

## 2017-03-27 NOTE — Op Note (Signed)
[facility] Gastroenterology Patient Name: Kerry Jordan Procedure Date: 03/27/2017 8:14 AM MRN: [id number] Account #: [id number] Date of Birth: 1945/09/29 Admit Type: Outpatient Age: 72 Room: [facility] ENDO ROOM 1 Gender: Female Note Status: Finalized Procedure:            Colonoscopy Indications:          Screening for colorectal malignant neoplasm Providers:            Lollie Sails, MD Medicines:            Monitored Anesthesia Care Complications:        No immediate complications. Procedure:            Pre-Anesthesia Assessment:                       - ASA Grade Assessment: II - A patient with mild                        systemic disease.                       After obtaining informed consent, the colonoscope was                        passed under direct vision. Throughout the procedure,                        the patient's blood pressure, pulse, and oxygen                        saturations were monitored continuously. The Olympus                        PCF-H180AL colonoscope ( S#: [id number] ) was introduced                        through the anus and advanced to the the cecum,                        identified by appendiceal orifice and ileocecal valve.                        The colonoscopy was performed without difficulty. The                        patient tolerated the procedure well. The quality of                        the bowel preparation was good. Findings:      A few small-mouthed diverticula were found in the sigmoid colon.      A 4 mm polyp was found in the rectum. The polyp was sessile. The polyp       was removed with a cold snare. Resection and retrieval were complete.      A 3 mm polyp was found in the ileocecal valve. The polyp was sessile.       The polyp was removed with a cold biopsy forceps. Resection and       retrieval were complete.      Four sessile polyps were found in the rectum. The polyps were 1 to 3 mm  in size. These  polyps were removed with a cold biopsy forceps. Resection       and retrieval were complete.      The retroflexed view of the distal rectum and anal verge was normal and       showed no anal or rectal abnormalities.      The perianal exam findings include skin tags. Impression:           - Diverticulosis in the sigmoid colon.                       - One 4 mm polyp in the rectum, removed with a cold                        snare. Resected and retrieved.                       - One 3 mm polyp at the ileocecal valve, removed with a                        cold biopsy forceps. Resected and retrieved.                       - Four 1 to 3 mm polyps in the rectum, removed with a                        cold biopsy forceps. Resected and retrieved.                       - The distal rectum and anal verge are normal on                        retroflexion view.                       - Perianal skin tags found on perianal exam. Recommendation:       - Discharge patient to home. Procedure Code(s):    --- Professional ---                       [phone], Colonoscopy, flexible; with removal of tumor(s),                        polyp(s), or other lesion(s) by snare technique                       45380, 29, Colonoscopy, flexible; with biopsy, single                        or multiple Diagnosis Code(s):    --- Professional ---                       Z12.11, Encounter for screening for malignant neoplasm                        of colon                       K62.1, Rectal polyp                       D12.0,  Benign neoplasm of cecum                       K64.4, Residual hemorrhoidal skin tags                       K57.30, Diverticulosis of large intestine without                        perforation or abscess without bleeding CPT copyright 2016 American Medical Association. All rights reserved. The codes documented in this report are preliminary and upon coder review may  be revised to meet current compliance  requirements. Lollie Sails, MD 03/27/2017 8:49:29 AM This report has been signed electronically. Number of Addenda: 0 Note Initiated On: 03/27/2017 8:14 AM Scope Withdrawal Time: 0 hours 12 minutes 11 seconds  Total Procedure Duration: 0 hours 22 minutes 36 seconds       [facility]

## 2017-03-27 NOTE — Anesthesia Preprocedure Evaluation (Signed)
Anesthesia Evaluation  Patient identified by MRN, date of birth, ID band Patient awake    Reviewed: Allergy & Precautions, H&P , NPO status , reviewed documented beta blocker date and time   Airway Mallampati: III  TM Distance: >3 FB     Dental  (+) Chipped, Missing   Pulmonary    Pulmonary exam normal        Cardiovascular hypertension, Normal cardiovascular exam     Neuro/Psych PSYCHIATRIC DISORDERS Anxiety    GI/Hepatic GERD  Medicated and Controlled,  Endo/Other  diabetes, Type 2  Renal/GU      Musculoskeletal   Abdominal   Peds  Hematology   Anesthesia Other Findings   Reproductive/Obstetrics                             Anesthesia Physical Anesthesia Plan  ASA: II  Anesthesia Plan: General   Post-op Pain Management:    Induction:   PONV Risk Score and Plan: Propofol infusion and TIVA  Airway Management Planned:   Additional Equipment:   Intra-op Plan:   Post-operative Plan:   Informed Consent: I have reviewed the patients History and Physical, chart, labs and discussed the procedure including the risks, benefits and alternatives for the proposed anesthesia with the patient or authorized representative who has indicated his/her understanding and acceptance.   Dental Advisory Given  Plan Discussed with: CRNA  Anesthesia Plan Comments:         Anesthesia Quick Evaluation

## 2017-03-27 NOTE — H&P (Signed)
Outpatient short stay form Pre-procedure 03/27/2017 8:11 AM Lollie Sails MD  Primary Physician: Delfina Redwood PA  Reason for visit: Screening colonoscopy  History of present illness: Patient is a 72 year old female presenting today as above.  She tolerated her prep well.  She takes no aspirin or blood thinning agent with the exception of 81 mg aspirin.  He does not remember any personal history or family history of colon polyps.  She did have a colonoscopy several years ago that was negative apparently.    Current Facility-Administered Medications:  .  0.9 %  sodium chloride infusion, , Intravenous, Continuous, Lollie Sails, MD, Last Rate: 20 mL/hr at 03/27/17 0807, 1,000 mL at 03/27/17 0807 .  0.9 %  sodium chloride infusion, , Intravenous, Continuous, Lollie Sails, MD  Medications Prior to Admission  Medication Sig Dispense Refill Last Dose  . carvedilol (COREG) 6.25 MG tablet Take 6.25 mg by mouth 2 (two) times daily with a meal.   03/27/2017 at Unknown time  . hydrochlorothiazide (HYDRODIURIL) 25 MG tablet Take 25 mg by mouth daily.   03/27/2017 at Unknown time  . lisinopril (PRINIVIL,ZESTRIL) 2.5 MG tablet Take 2.5 mg by mouth daily.   03/27/2017 at Unknown time  . triamcinolone cream (KENALOG) 0.1 % Apply 1 application topically 2 (two) times daily.     . valACYclovir (VALTREX) 500 MG tablet Take 500 mg by mouth 2 (two) times daily.     Marland Kitchen lovastatin (MEVACOR) 40 MG tablet Take 40 mg by mouth at bedtime.     . metFORMIN (GLUCOPHAGE) 500 MG tablet Take by mouth 2 (two) times daily with a meal.        Allergies  Allergen Reactions  . Sulfa Antibiotics Nausea And Vomiting     Past Medical History:  Diagnosis Date  . Anxiety   . Diabetes mellitus without complication ([place])   . Hypercholesteremia   . Hypertension     Review of systems:      Physical Exam    Heart and lungs: You rate and rhythm without rub or gallop, lungs are bilaterally clear.    HEENT:  Normocephalic atraumatic eyes are anicteric    Other:    Pertinant exam for procedure: Soft nontender nondistended bowel sounds positive normoactive    Planned proceedures: Colonoscopy and indicated procedures. I have discussed the risks benefits and complications of procedures to include not limited to bleeding, infection, perforation and the risk of sedation and the patient wishes to proceed.    Lollie Sails, MD Gastroenterology 03/27/2017  8:11 AM

## 2017-03-27 NOTE — Anesthesia Postprocedure Evaluation (Signed)
Anesthesia Post Note  Patient: Kerry Jordan  Procedure(s) Performed: COLONOSCOPY WITH PROPOFOL (N/A )  Patient location during evaluation: Endoscopy Anesthesia Type: General Level of consciousness: awake and alert Pain management: pain level controlled Vital Signs Assessment: post-procedure vital signs reviewed and stable Respiratory status: spontaneous breathing, nonlabored ventilation and respiratory function stable Cardiovascular status: blood pressure returned to baseline and stable Postop Assessment: no apparent nausea or vomiting Anesthetic complications: no     Last Vitals:  Vitals:   03/27/17 0920 03/27/17 0931  BP: 127/70 (!) 149/79  Pulse: 62 63  Resp: 13 18  Temp:    SpO2: 96% 98%    Last Pain:  Vitals:   03/27/17 0931  TempSrc:   PainSc: 0-No pain                 Alphonsus Sias

## 2017-03-27 NOTE — Transfer of Care (Signed)
Immediate Anesthesia Transfer of Care Note  Patient: Kerry Jordan  Procedure(s) Performed: COLONOSCOPY WITH PROPOFOL (N/A )  Patient Location: PACU and Endoscopy Unit  Anesthesia Type:General  Level of Consciousness: drowsy  Airway & Oxygen Therapy: Patient Spontanous Breathing and Patient connected to nasal cannula oxygen  Post-op Assessment: Report given to RN and Post -op Vital signs reviewed and stable  Post vital signs: Reviewed and stable  Last Vitals:  Vitals Value Taken Time  BP 126/77 03/27/2017  8:49 AM  Temp 36.8 C 03/27/2017  8:49 AM  Pulse    Resp 18 03/27/2017  8:49 AM  SpO2 98 % 03/27/2017  8:49 AM    Last Pain:  Vitals:   03/27/17 0748  TempSrc: Tympanic  PainSc: 0-No pain         Complications: No apparent anesthesia complications

## 2017-03-27 NOTE — Anesthesia Post-op Follow-up Note (Signed)
Anesthesia QCDR form completed.        

## 2017-03-28 ENCOUNTER — Encounter: Payer: Self-pay | Admitting: Gastroenterology

## 2017-03-28 LAB — SURGICAL PATHOLOGY

## 2017-05-23 ENCOUNTER — Other Ambulatory Visit: Payer: Self-pay | Admitting: Physician Assistant

## 2017-05-23 DIAGNOSIS — Z1231 Encounter for screening mammogram for malignant neoplasm of breast: Secondary | ICD-10-CM

## 2017-06-05 ENCOUNTER — Inpatient Hospital Stay: Admission: RE | Admit: 2017-06-05 | Payer: Medicare Other | Source: Ambulatory Visit

## 2017-06-14 ENCOUNTER — Ambulatory Visit
Admission: RE | Admit: 2017-06-14 | Discharge: 2017-06-14 | Disposition: A | Discharge status: Home / Self Care | Payer: Medicare Other | Source: Ambulatory Visit | Attending: Physician Assistant | Admitting: Physician Assistant

## 2017-06-14 DIAGNOSIS — N959 Unspecified menopausal and perimenopausal disorder: Secondary | ICD-10-CM | POA: Insufficient documentation

## 2017-06-14 DIAGNOSIS — Z1231 Encounter for screening mammogram for malignant neoplasm of breast: Secondary | ICD-10-CM | POA: Insufficient documentation

## 2017-06-14 DIAGNOSIS — M810 Age-related osteoporosis without current pathological fracture: Secondary | ICD-10-CM | POA: Diagnosis present

## 2018-08-09 ENCOUNTER — Encounter: Payer: Self-pay | Admitting: Cardiology

## 2018-08-14 ENCOUNTER — Other Ambulatory Visit: Payer: Self-pay | Admitting: Student

## 2018-08-14 DIAGNOSIS — Z1231 Encounter for screening mammogram for malignant neoplasm of breast: Secondary | ICD-10-CM

## 2018-09-13 ENCOUNTER — Encounter (INDEPENDENT_AMBULATORY_CARE_PROVIDER_SITE_OTHER): Payer: Self-pay

## 2018-09-13 ENCOUNTER — Other Ambulatory Visit: Payer: Self-pay

## 2018-09-13 ENCOUNTER — Ambulatory Visit
Admission: RE | Admit: 2018-09-13 | Discharge: 2018-09-13 | Disposition: A | Discharge status: Home / Self Care | Payer: Medicare Other | Source: Ambulatory Visit | Attending: Family Medicine | Admitting: Family Medicine

## 2018-09-13 DIAGNOSIS — Z1231 Encounter for screening mammogram for malignant neoplasm of breast: Secondary | ICD-10-CM

## 2019-07-20 ENCOUNTER — Ambulatory Visit
Admission: EM | Admit: 2019-07-20 | Discharge: 2019-07-20 | Disposition: A | Discharge status: Home / Self Care | Payer: Medicare PPO | Attending: Family Medicine | Admitting: Family Medicine

## 2019-07-20 ENCOUNTER — Other Ambulatory Visit: Payer: Self-pay

## 2019-07-20 ENCOUNTER — Ambulatory Visit (INDEPENDENT_AMBULATORY_CARE_PROVIDER_SITE_OTHER): Payer: Medicare PPO

## 2019-07-20 DIAGNOSIS — S2232XA Fracture of one rib, left side, initial encounter for closed fracture: Secondary | ICD-10-CM | POA: Diagnosis not present

## 2019-07-20 NOTE — ED Provider Notes (Signed)
[facility]    CSN: [id number] Arrival date & time: 07/20/19  1149      History   Chief Complaint Chief Complaint  Patient presents with  . Rib Pain    HPI Kerry Jordan is a 74 y.o. female.   74 yo female with a c/o left sided rib pain since falling at home 3 days ago. States that she tripped over her dog and landed hitting her left chest wall. States pain is mild but consistent. Denies any difficulty breathing. Has been taking over the counter analgesics with good relief.      Past Medical History:  Diagnosis Date  . Anxiety   . Diabetes mellitus without complication ([place])   . Hypercholesteremia   . Hypertension     There are no problems to display for this patient.   Past Surgical History:  Procedure Laterality Date  . BREAST BIOPSY Left 01/03/12   [place] bx/clip-neg  . COLONOSCOPY WITH PROPOFOL N/A 03/27/2017   Procedure: COLONOSCOPY WITH PROPOFOL;  Surgeon: Lollie Sails, MD;  Location: [facility] ENDOSCOPY;  Service: Endoscopy;  Laterality: N/A;  . FOOT SURGERY      OB History   No obstetric history on file.      Home Medications    Prior to Admission medications   Medication Sig Start Date End Date Taking? Authorizing Provider  carvedilol (COREG) 6.25 MG tablet Take 6.25 mg by mouth 2 (two) times daily with a meal.   Yes [provider]  hydrochlorothiazide (HYDRODIURIL) 25 MG tablet Take 25 mg by mouth daily.   Yes [provider]  lisinopril (PRINIVIL,ZESTRIL) 2.5 MG tablet Take 2.5 mg by mouth daily.   Yes [provider]  lovastatin (MEVACOR) 40 MG tablet Take 40 mg by mouth at bedtime.   Yes [provider]  methotrexate 2.5 MG tablet  07/16/19  Yes [provider]  triamcinolone cream (KENALOG) 0.1 % Apply 1 application topically 2 (two) times daily.   Yes [provider]  valACYclovir (VALTREX) 500 MG tablet Take 500 mg by mouth 2 (two) times daily.   Yes [provider]    metFORMIN (GLUCOPHAGE) 500 MG tablet Take by mouth 2 (two) times daily with a meal.    [provider]    Family History Family History  Problem Relation Age of Onset  . Blindness Mother   . Diabetes Mother   . Heart failure Mother   . Heart attack Father   . Breast cancer Neg Hx     Social History Social History   Tobacco Use  . Smoking status: Never Smoker  . Smokeless tobacco: Never Used  Substance Use Topics  . Alcohol use: Yes    Comment: occasional  . Drug use: No     Allergies   Sulfa antibiotics   Review of Systems Review of Systems   Physical Exam Triage Vital Signs ED Triage Vitals  Enc Vitals Group     BP 07/20/19 1211 (!) 146/62     Pulse Rate 07/20/19 1211 68     Resp 07/20/19 1211 14     Temp 07/20/19 1211 98.3 F (36.8 C)     Temp Source 07/20/19 1211 Oral     SpO2 07/20/19 1211 97 %     Weight 07/20/19 1211 104 lb (47.2 kg)     Height 07/20/19 1211 4\' 9"  (1.448 m)     Head Circumference --      Peak Flow --  Pain Score 07/20/19 1210 4     Pain Loc --      Pain Edu? --      Excl. in [place]? --    No data found.  Updated Vital Signs BP (!) 146/62 (BP Location: Left Arm)   Pulse 68   Temp 98.3 F (36.8 C) (Oral)   Resp 14   Ht 4\' 9"  (1.448 m)   Wt 47.2 kg   SpO2 97%   BMI 22.51 kg/m   Visual Acuity Right Eye Distance:   Left Eye Distance:   Bilateral Distance:    Right Eye Near:   Left Eye Near:    Bilateral Near:     Physical Exam Vitals reviewed.  Constitutional:      General: She is not in acute distress.    Appearance: She is not toxic-appearing or diaphoretic.  Cardiovascular:     Rate and Rhythm: Normal rate.     Heart sounds: Normal heart sounds.  Pulmonary:     [place]: Pulmonary effort is normal. No respiratory distress.     Breath sounds: Normal breath sounds.  Chest:     Chest wall: Tenderness present.  Neurological:     Mental Status: She is alert.      UC Treatments / Results   Labs (all labs ordered are listed, but only abnormal results are displayed) Labs Reviewed - No data to display  EKG   Radiology DG Ribs Unilateral W/Chest Left  Result Date: 07/20/2019 CLINICAL DATA:  Left rib pain after fall several days ago. EXAM: LEFT RIBS AND CHEST - 3+ VIEW COMPARISON:  May 17, 2016. FINDINGS: Minimally displaced fracture is seen involving the anterior portion of the left seventh rib. There is no evidence of pneumothorax or pleural effusion. Both lungs are clear. Heart size and mediastinal contours are within normal limits. IMPRESSION: Minimally displaced left seventh rib fracture. Electronically Signed   By: Marijo Conception M.D.   On: 07/20/2019 13:10    Procedures Procedures (including critical care time)  Medications Ordered in UC Medications - No data to display  Initial Impression / Assessment and Plan / UC Course  I have reviewed the triage vital signs and the nursing notes.  Pertinent labs & imaging results that were available during my care of the patient were reviewed by me and considered in my medical decision making (see chart for details).      Final Clinical Impressions(s) / UC Diagnoses   Final diagnoses:  Closed fracture of one rib of left side, initial encounter     Discharge Instructions     Rest, ice, tylenol or advil    ED Prescriptions    None     1. X-ray results and diagnosis reviewed with patient 2. rx as per orders above; reviewed possible side effects, interactions, risks and benefits  3. Recommend supportive treatment as above 4. Follow-up prn if symptoms worsen or don't improve   PDMP not reviewed this encounter.   Norval Gable, MD 07/20/19 [phone]

## 2019-07-20 NOTE — ED Triage Notes (Signed)
Patient complains of left rib pain that is mainly when she laying down and moving to the other side. Patient states that she doesn't notice it when she is walking or standing upright. Pateint states that she has anxiety about medical issues and would rather have this checked out for peace of mind.

## 2019-07-20 NOTE — Discharge Instructions (Signed)
Rest, ice, tylenol or advil

## 2019-10-16 ENCOUNTER — Other Ambulatory Visit: Payer: Self-pay | Admitting: Student

## 2019-10-16 DIAGNOSIS — Z1231 Encounter for screening mammogram for malignant neoplasm of breast: Secondary | ICD-10-CM

## 2019-10-16 DIAGNOSIS — M810 Age-related osteoporosis without current pathological fracture: Secondary | ICD-10-CM

## 2019-10-29 DIAGNOSIS — L409 Psoriasis, unspecified: Secondary | ICD-10-CM | POA: Insufficient documentation

## 2019-11-14 ENCOUNTER — Ambulatory Visit
Admission: RE | Admit: 2019-11-14 | Discharge: 2019-11-14 | Disposition: A | Discharge status: Home / Self Care | Payer: Medicare PPO | Source: Ambulatory Visit | Attending: Student | Admitting: Student

## 2019-11-14 ENCOUNTER — Other Ambulatory Visit: Payer: Self-pay

## 2019-11-14 DIAGNOSIS — Z1231 Encounter for screening mammogram for malignant neoplasm of breast: Secondary | ICD-10-CM | POA: Insufficient documentation

## 2019-11-14 DIAGNOSIS — M810 Age-related osteoporosis without current pathological fracture: Secondary | ICD-10-CM | POA: Diagnosis not present

## 2019-11-20 ENCOUNTER — Emergency Department: Payer: Medicare PPO

## 2019-11-20 ENCOUNTER — Encounter: Payer: Self-pay | Admitting: Emergency Medicine

## 2019-11-20 ENCOUNTER — Other Ambulatory Visit: Payer: Self-pay

## 2019-11-20 ENCOUNTER — Emergency Department
Admission: EM | Admit: 2019-11-20 | Discharge: 2019-11-20 | Disposition: A | Discharge status: Home / Self Care | Payer: Medicare PPO | Attending: Emergency Medicine | Admitting: Emergency Medicine

## 2019-11-20 DIAGNOSIS — R55 Syncope and collapse: Secondary | ICD-10-CM

## 2019-11-20 DIAGNOSIS — Z7984 Long term (current) use of oral hypoglycemic drugs: Secondary | ICD-10-CM | POA: Insufficient documentation

## 2019-11-20 DIAGNOSIS — Z79899 Other long term (current) drug therapy: Secondary | ICD-10-CM | POA: Insufficient documentation

## 2019-11-20 DIAGNOSIS — I10 Essential (primary) hypertension: Secondary | ICD-10-CM | POA: Diagnosis not present

## 2019-11-20 DIAGNOSIS — F419 Anxiety disorder, unspecified: Secondary | ICD-10-CM | POA: Diagnosis not present

## 2019-11-20 DIAGNOSIS — E119 Type 2 diabetes mellitus without complications: Secondary | ICD-10-CM | POA: Diagnosis not present

## 2019-11-20 DIAGNOSIS — R5383 Other fatigue: Secondary | ICD-10-CM | POA: Diagnosis not present

## 2019-11-20 LAB — BASIC METABOLIC PANEL
Anion gap: 16 — ABNORMAL HIGH (ref 5–15)
BUN: 29 mg/dL — ABNORMAL HIGH (ref 8–23)
CO2: 18 mmol/L — ABNORMAL LOW (ref 22–32)
Calcium: 9 mg/dL (ref 8.9–10.3)
Chloride: 98 mmol/L (ref 98–111)
Creatinine, Ser: 0.56 mg/dL (ref 0.44–1.00)
GFR, Estimated: >60 mL/min (ref >60)
Glucose, Bld: 138 mg/dL — ABNORMAL HIGH (ref 70–99)
Potassium: 3.1 mmol/L — ABNORMAL LOW (ref 3.5–5.1)
Sodium: 132 mmol/L — ABNORMAL LOW (ref 135–145)

## 2019-11-20 LAB — CBC
HCT: 40.6 % (ref 36.0–46.0)
Hemoglobin: 14.5 g/dL (ref 12.0–15.0)
MCH: 31.4 pg (ref 26.0–34.0)
MCHC: 35.7 g/dL (ref 30.0–36.0)
MCV: 87.9 fL (ref 80.0–100.0)
Platelets: 338 10*3/uL (ref 150–400)
RBC: 4.62 MIL/uL (ref 3.87–5.11)
RDW: 13.6 % (ref 11.5–15.5)
WBC: 10 10*3/uL (ref 4.0–10.5)
nRBC: 0 % (ref 0.0–0.2)

## 2019-11-20 LAB — GLUCOSE, CAPILLARY: Glucose-Capillary: 120 mg/dL — ABNORMAL HIGH (ref 70–99)

## 2019-11-20 MED ORDER — SODIUM CHLORIDE 0.9 % IV BOLUS
500.0000 mL | Freq: Once | INTRAVENOUS | Status: AC
  Administered 2019-11-20: 500 mL via INTRAVENOUS

## 2019-11-20 NOTE — ED Triage Notes (Signed)
Pt came into the ED to visit her brother and got up to use the restroom.  Stated she felt dizzy and then had a syncopal episode in the chair of the patient's room.  Pt neurologically intact at this time but states she still has dizziness.  PT has h/o syncopal episodes as well.  PT did lose control of bladder. Pt in NAd with even and unlabored respirations.

## 2019-11-20 NOTE — ED Provider Notes (Signed)
[facility] Emergency Department Provider Note   ____________________________________________    I have reviewed the triage vital signs and the nursing notes.   HISTORY  Chief Complaint Loss of Consciousness     HPI Kerry Jordan is a 74 y.o. female with a history as noted below who presents after a syncopal episode.  Patient reports that she is visiting her brother in the emergency department after he was in an accident and she was feeling quite anxious.  She notes she is very close to him and he was very worried about him.  She went to stand up and felt lightheaded and dizzy and apparently had a syncopal episode and collapsed on a bench.  She denies headache or neuro deficits.  No chest pain or palpitations.  No nausea or vomiting.  Feels well now although reports some mild fatigue.  She reports this is happened multiple times in her life and she attributes it to dehydration frequently.  Past Medical History:  Diagnosis Date   Anxiety    Diabetes mellitus without complication ([place])    Hypercholesteremia    Hypertension     There are no problems to display for this patient.   Past Surgical History:  Procedure Laterality Date   BREAST BIOPSY Left 01/03/12   [place] bx/clip-neg   COLONOSCOPY WITH PROPOFOL N/A 03/27/2017   Procedure: COLONOSCOPY WITH PROPOFOL;  Surgeon: Lollie Sails, MD;  Location: [facility] ENDOSCOPY;  Service: Endoscopy;  Laterality: N/A;   FOOT SURGERY      Prior to Admission medications   Medication Sig Start Date End Date Taking? Authorizing Provider  carvedilol (COREG) 6.25 MG tablet Take 6.25 mg by mouth 2 (two) times daily with a meal.    [provider]  hydrochlorothiazide (HYDRODIURIL) 25 MG tablet Take 25 mg by mouth daily.    [provider]  lisinopril (PRINIVIL,ZESTRIL) 2.5 MG tablet Take 2.5 mg by mouth daily.    [provider]  lovastatin (MEVACOR) 40 MG tablet Take 40 mg by  mouth at bedtime.    [provider]  metFORMIN (GLUCOPHAGE) 500 MG tablet Take by mouth 2 (two) times daily with a meal.    [provider]  methotrexate 2.5 MG tablet  07/16/19   [provider]  triamcinolone cream (KENALOG) 0.1 % Apply 1 application topically 2 (two) times daily.    [provider]  valACYclovir (VALTREX) 500 MG tablet Take 500 mg by mouth 2 (two) times daily.    [provider]     Allergies Sulfa antibiotics  Family History  Problem Relation Age of Onset   Blindness Mother    Diabetes Mother    Heart failure Mother    Heart attack Father    Breast cancer Neg Hx     Social History Social History   Tobacco Use   Smoking status: Never Smoker   Smokeless tobacco: Never Used  Substance Use Topics   Alcohol use: Yes    Comment: occasional   Drug use: No    Review of Systems  Constitutional: No fever/chills Eyes: No visual changes.  ENT: No sore throat. Cardiovascular: Denies chest pain. Respiratory: Denies shortness of breath. Gastrointestinal: No abdominal pain.  No nausea, no vomiting.   Genitourinary: Negative for dysuria. Musculoskeletal: Negative for back pain. Skin: Negative for rash. Neurological: Negative for headaches or weakness   ____________________________________________   PHYSICAL EXAM:  VITAL SIGNS: ED Triage Vitals  Enc Vitals Group  BP 11/20/19 1409 (!) 162/71     Pulse Rate 11/20/19 1409 69     Resp 11/20/19 1409 17     Temp 11/20/19 1409 98.2 F (36.8 C)     Temp Source 11/20/19 1409 Oral     SpO2 11/20/19 1409 98 %     Weight 11/20/19 1410 50.8 kg (112 lb)     Height 11/20/19 1410 1.448 m (4\' 9" )     Head Circumference --      Peak Flow --      Pain Score 11/20/19 1410 0     Pain Loc --      Pain Edu? --      Excl. in [place]? --     Constitutional: Alert and oriented. No acute distress. Pleasant and interactive Eyes: Conjunctivae are normal.  Head:  Atraumatic. Nose: No congestion/rhinnorhea.  Cardiovascular: Normal rate, regular rhythm. Kermit Balo peripheral circulation. Respiratory: Normal respiratory effort.  No retractions. Lungs CTAB. Gastrointestinal: Soft and nontender. No distention.  No CVA tenderness. Genitourinary: deferred Musculoskeletal: No lower extremity tenderness nor edema.  Warm and well perfused Neurologic:  Normal speech and language. No gross focal neurologic deficits are appreciated.  Skin:  Skin is warm, dry and intact. No rash noted. Psychiatric: Mood and affect are normal. Speech and behavior are normal.  ____________________________________________   LABS (all labs ordered are listed, but only abnormal results are displayed)  Labs Reviewed  BASIC METABOLIC PANEL - Abnormal; Notable for the following components:      Result Value   Sodium 132 (*)    Potassium 3.1 (*)    CO2 18 (*)    Glucose, Bld 138 (*)    BUN 29 (*)    Anion gap 16 (*)    All other components within normal limits  GLUCOSE, CAPILLARY - Abnormal; Notable for the following components:   Glucose-Capillary 120 (*)    All other components within normal limits  CBC  URINALYSIS, COMPLETE (UACMP) WITH MICROSCOPIC  CBG MONITORING, ED   ____________________________________________  EKG  ED ECG REPORT I, Lavonia Drafts, the attending physician, personally viewed and interpreted this ECG.  Date: 11/20/2019  Rhythm: normal sinus rhythm QRS Axis: normal Intervals: normal ST/T Wave abnormalities: normal Narrative Interpretation: no evidence of acute ischemia  ____________________________________________  RADIOLOGY  Chest x-ray is reassuring ____________________________________________   PROCEDURES  Procedure(s) performed: No  Procedures   Critical Care performed: No ____________________________________________   INITIAL IMPRESSION / ASSESSMENT AND PLAN / ED COURSE  Pertinent labs & imaging results that were available  during my care of the patient were reviewed by me and considered in my medical decision making (see chart for details).  Patient presents after likely syncopal episode as described above.  This is happened to her multiple times in her life and today's episode was consistent with prior pattern.  Lab work is notable for elevated BUN consistent with mild dehydration, also bicarb of 18 but no evidence of infection, normal glucose..  .  EKG is unremarkable.  We will treat with IV fluids and reevaluate.  Patient finished IV fluids and is anxious to leave, she is feeling well, no further dizziness, appropriate for discharge with outpatient follow-up.  ____________________________________________   FINAL CLINICAL IMPRESSION(S) / ED DIAGNOSES  Final diagnoses:  Syncope and collapse        Note:  This document was prepared using Dragon voice recognition software and may include unintentional dictation errors.   Lavonia Drafts, MD 11/20/19 [phone]

## 2020-02-05 DIAGNOSIS — M810 Age-related osteoporosis without current pathological fracture: Secondary | ICD-10-CM | POA: Insufficient documentation

## 2020-02-25 DIAGNOSIS — R55 Syncope and collapse: Secondary | ICD-10-CM | POA: Insufficient documentation

## 2020-02-26 ENCOUNTER — Other Ambulatory Visit: Payer: Self-pay | Admitting: Neurology

## 2020-02-26 DIAGNOSIS — R4182 Altered mental status, unspecified: Secondary | ICD-10-CM

## 2020-03-02 ENCOUNTER — Other Ambulatory Visit: Payer: Self-pay

## 2020-03-02 ENCOUNTER — Ambulatory Visit (INDEPENDENT_AMBULATORY_CARE_PROVIDER_SITE_OTHER): Payer: Medicare PPO

## 2020-03-02 ENCOUNTER — Encounter: Payer: Self-pay | Admitting: Cardiology

## 2020-03-02 ENCOUNTER — Ambulatory Visit: Payer: Medicare PPO | Admitting: Cardiology

## 2020-03-02 VITALS — BP 174/84 | HR 62 | Ht <= 58 in | Wt 108.4 lb

## 2020-03-02 DIAGNOSIS — I10 Essential (primary) hypertension: Secondary | ICD-10-CM | POA: Diagnosis not present

## 2020-03-02 DIAGNOSIS — R55 Syncope and collapse: Secondary | ICD-10-CM

## 2020-03-02 DIAGNOSIS — E7800 Pure hypercholesterolemia, unspecified: Secondary | ICD-10-CM

## 2020-03-02 NOTE — Patient Instructions (Signed)
Medication Instructions:  Your physician recommends that you continue on your current medications as directed. Please refer to the Current Medication list given to you today.  *If you need a refill on your cardiac medications before your next appointment, please call your pharmacy*   Lab Work: None ordered If you have labs (blood work) drawn today and your tests are completely normal, you will receive your results only by: Marland Kitchen MyChart Message (if you have MyChart) OR . A paper copy in the mail If you have any lab test that is abnormal or we need to change your treatment, we will call you to review the results.   Testing/Procedures:  1.  Your physician has requested that you have an echocardiogram. Echocardiography is a painless test that uses sound waves to create images of your heart. It provides your doctor with information about the size and shape of your heart and how well your heart's chambers and valves are working. This procedure takes approximately one hour. There are no restrictions for this procedure.  2.  Your physician has recommended that you wear a Zio monitor for 10 days. This monitor is a medical device that records the heart's electrical activity. Doctors most often use these monitors to diagnose arrhythmias. Arrhythmias are problems with the speed or rhythm of the heartbeat. The monitor is a small device applied to your chest. You can wear one while you do your normal daily activities. While wearing this monitor if you have any symptoms to push the button and record what you felt. Once you have worn this monitor for the period of time provider prescribed (Usually 14 days), you will return the monitor device in the postage paid box. Once it is returned they will download the data collected and provide [place] with a report which the provider will then review and we will call you with those results. Important tips:  1. Avoid showering during the first 24 hours of wearing the  monitor. 2. Avoid excessive sweating to help maximize wear time. 3. Do not submerge the device, no hot tubs, and no swimming pools. 4. Keep any lotions or oils away from the patch. 5. After 24 hours you may shower with the patch on. Take brief showers with your back facing the shower head.  6. Do not remove patch once it has been placed because that will interrupt data and decrease adhesive wear time. 7. Push the button when you have any symptoms and write down what you were feeling. 8. Once you have completed wearing your monitor, remove and place into box which has postage paid and place in your outgoing mailbox.  9. If for some reason you have misplaced your box then call our office and we can provide another box and/or mail it off for you.          Follow-Up: At [facility], you and your health needs are our priority.  As part of our continuing mission to provide you with exceptional heart care, we have created designated Provider Care Teams.  These Care Teams include your primary Cardiologist (physician) and Advanced Practice Providers (APPs -  Physician Assistants and Nurse Practitioners) who all work together to provide you with the care you need, when you need it.  We recommend signing up for the patient portal called "MyChart".  Sign up information is provided on this After Visit Summary.  MyChart is used to connect with patients for Virtual Visits (Telemedicine).  Patients are able to view lab/test results, encounter notes,  upcoming appointments, etc.  Non-urgent messages can be sent to your provider as well.   To learn more about what you can do with MyChart, go to [email or web address].    Your next appointment:   5 week(s)  The format for your next appointment:   In Person  Provider:   Kate Sable, MD   Other Instructions

## 2020-03-02 NOTE — Progress Notes (Signed)
Cardiology Office Note:    Date:  03/02/2020   ID:  Kerry Jordan, DOB 05/20/1945, MRN [id number]  PCP:  Dewayne Shorter, [place]  Cardiologist:  Kate Sable, MD  Advanced Practice Provider:  No care team member to display Electrophysiologist:  None       Referring MD: Dewayne Shorter, MD   Chief Complaint  Patient presents with  . Other    Syncope. Meds reviewed verbally with pt.    History of Present Illness:    Kerry Jordan is a 75 y.o. female with a hx of anxiety, diabetes, hyperlipidemia, hypertension presents due to syncope.  She presented to the ED 11/2019 because her brother fell from a ladder and broke his leg.  While in the ED, she states being very stressed and anxious, felt dizzy and passed out.  She remembers feeling dizzy, telemetry staff members that she needs to lay down.  She apparently was incontinent to bowel and urine.  Denies chest pain, shortness of breath, palpitations.  She has not had any similar episodes since.  Previous episode of syncope was many years ago which was attributed to dehydration.  She was seen in the ED, given IV fluids, with resolution of symptoms.  Since hospital discharge, patient is being followed by neurology where work-up with EEG, MRI is being planned.  He denies any history of heart disease.  States being very anxious, with blood pressure elevations whenever she presents to the physician office.  Her blood pressure at home is usually around 326Z to 124P systolic.  Past Medical History:  Diagnosis Date  . Anxiety   . Diabetes mellitus without complication ([place])   . Hypercholesteremia   . Hypertension     Past Surgical History:  Procedure Laterality Date  . BREAST BIOPSY Left 01/03/12   [place] bx/clip-neg  . COLONOSCOPY WITH PROPOFOL N/A 03/27/2017   Procedure: COLONOSCOPY WITH PROPOFOL;  Surgeon: Lollie Sails, MD;  [facility]: [facility] END[facility]COPY;  Service: Endoscopy;  Laterality:  N/A;  . FOOT SURGERY      Current Medications: Current Meds  Medication Sig  . carvedilol (COREG) 6.25 MG tablet Take 6.25 mg by mouth 2 (two) times daily with a meal.  . hydrochlorothiazide (HYDRODIURIL) 25 MG tablet Take 25 mg by mouth daily.  Marland Kitchen lisinopril (PRINIVIL,ZESTRIL) 2.5 MG tablet Take 2.5 mg by mouth daily.  Marland Kitchen lovastatin (MEVACOR) 40 MG tablet Take 40 mg by mouth at bedtime.  . metFORMIN (GLUCOPHAGE) 500 MG tablet Take by mouth 2 (two) times daily with a meal.  . methotrexate 2.5 MG tablet Take 15 mg by mouth once a week.  . triamcinolone cream (KENALOG) 0.1 % Apply 1 application topically 2 (two) times daily.  . valACYclovir (VALTREX) 500 MG tablet Take 500 mg by mouth 2 (two) times daily.     Allergies:   Sulfa antibiotics   Social History   Socioeconomic History  . Marital status: Single    Spouse name: Not on file  . Number of children: Not on file  . Years of education: Not on file  . Highest education level: Not on file  Occupational History  . Not on file  Tobacco Use  . Smoking status: Never Smoker  . Smokeless tobacco: Never Used  Substance and Sexual Activity  . Alcohol use: Not Currently    Comment: occasional  . Drug use: No  . Sexual activity: Not on file  Other Topics Concern  . Not on file  Social History Narrative  . Not on file   Social Determinants of Health   Financial Resource Strain: Not on file  Food Insecurity: Not on file  Transportation Needs: Not on file  Physical Activity: Not on file  Stress: Not on file  Social Connections: Not on file     Family History: The patient's family history includes Blindness in her mother; Diabetes in her mother; Heart attack in her father; Heart failure in her mother. There is no history of Breast cancer.  ROS:   Please see the history of present illness.     All other systems reviewed and are negative.  EKGs/Labs/Other Studies Reviewed:    The following studies were reviewed  today:   EKG:  EKG is  ordered today.  The ekg ordered today demonstrates normal sinus rhythm  Recent Labs: 11/20/2019: BUN 29; Creatinine, Ser 0.56; Hemoglobin 14.5; Platelets 338; Potassium 3.1; Sodium 132  Recent Lipid Panel No results found for: CHOL, TRIG, HDL, CHOLHDL, VLDL, LDLCALC, LDLDIRECT   Risk Assessment/Calculations:      Physical Exam:    VS:  BP (!) 174/84 (BP Location: Right Arm, Patient Position: Sitting, Cuff Size: Normal)   Pulse 62   Ht 4\' 9"  (1.448 m)   Wt 108 lb 6 oz (49.2 kg)   SpO2 96%   BMI 23.45 kg/m     Wt Readings from Last 3 Encounters:  03/02/20 108 lb 6 oz (49.2 kg)  11/20/19 112 lb (50.8 kg)  07/20/19 104 lb (47.2 kg)     GEN:  Well nourished, well developed in no acute distress HEENT: Normal NECK: No JVD; No carotid bruits LYMPHATICS: No lymphadenopathy CARDIAC: RRR, no murmurs, rubs, gallops RESPIRATORY:  Clear to auscultation without rales, wheezing or rhonchi  ABDOMEN: Soft, non-tender, non-distended MUSCULOSKELETAL:  No edema; No deformity  SKIN: Warm and dry NEUROLOGIC:  Alert and oriented x 3 PSYCHIATRIC:  Normal affect   ASSESSMENT:    1. Syncope and collapse   2. Primary hypertension   3. Pure hypercholesterolemia    PLAN:    In order of problems listed above:  1. Patient with incident of syncope and collapse.  Stool and bowel incontinence, could be neurologic questionable seizure.  No active or history of cardiac symptoms.  We will place a cardiac monitor to rule out any arrhythmias.  Obtain echocardiogram to evaluate presence of structural abnormalities. 2. Hypertension, BP elevated today, usually normal at home.  BP elevation likely due to stress.  Continue current BP meds as prescribed. 3. Hyperlipidemia, continue statin.   Follow-up after echo and cardiac monitor.      Medication Adjustments/Labs and Tests Ordered: Current medicines are reviewed at length with the patient today.  Concerns regarding  medicines are outlined above.  Orders Placed This Encounter  Procedures  . LONG TERM MONITOR (3-14 DAYS)  . EKG 12-Lead  . ECHOCARDIOGRAM COMPLETE   No orders of the defined types were placed in this encounter.   Patient Instructions  Medication Instructions:  Your physician recommends that you continue on your current medications as directed. Please refer to the Current Medication list given to you today.  *If you need a refill on your cardiac medications before your next appointment, please call your pharmacy*   Lab Work: None ordered If you have labs (blood work) drawn today and your tests are completely normal, you will receive your results only by: Marland Kitchen MyChart Message (if you have MyChart) OR . A paper copy in the mail If  you have any lab test that is abnormal or we need to change your treatment, we will call you to review the results.   Testing/Procedures:  1.  Your physician has requested that you have an echocardiogram. Echocardiography is a painless test that uses sound waves to create images of your heart. It provides your doctor with information about the size and shape of your heart and how well your heart's chambers and valves are working. This procedure takes approximately one hour. There are no restrictions for this procedure.  2.  Your physician has recommended that you wear a Zio monitor for 10 days. This monitor is a medical device that records the heart's electrical activity. Doctors most often use these monitors to diagnose arrhythmias. Arrhythmias are problems with the speed or rhythm of the heartbeat. The monitor is a small device applied to your chest. You can wear one while you do your normal daily activities. While wearing this monitor if you have any symptoms to push the button and record what you felt. Once you have worn this monitor for the period of time provider prescribed (Usually 14 days), you will return the monitor device in the postage paid box. Once it  is returned they will download the data collected and provide [place] with a report which the provider will then review and we will call you with those results. Important tips:  1. Avoid showering during the first 24 hours of wearing the monitor. 2. Avoid excessive sweating to help maximize wear time. 3. Do not submerge the device, no hot tubs, and no swimming pools. 4. Keep any lotions or oils away from the patch. 5. After 24 hours you may shower with the patch on. Take brief showers with your back facing the shower head.  6. Do not remove patch once it has been placed because that will interrupt data and decrease adhesive wear time. 7. Push the button when you have any symptoms and write down what you were feeling. 8. Once you have completed wearing your monitor, remove and place into box which has postage paid and place in your outgoing mailbox.  9. If for some reason you have misplaced your box then call our office and we can provide another box and/or mail it off for you.          Follow-Up: At [facility], you and your health needs are our priority.  As part of our continuing mission to provide you with exceptional heart care, we have created designated Provider Care Teams.  These Care Teams include your primary Cardiologist (physician) and Advanced Practice Providers (APPs -  Physician Assistants and Nurse Practitioners) who all work together to provide you with the care you need, when you need it.  We recommend signing up for the patient portal called "MyChart".  Sign up information is provided on this After Visit Summary.  MyChart is used to connect with patients for Virtual Visits (Telemedicine).  Patients are able to view lab/test results, encounter notes, upcoming appointments, etc.  Non-urgent messages can be sent to your provider as well.   To learn more about what you can do with MyChart, go to [email or web address].    Your next appointment:   5 week(s)  The format for  your next appointment:   In Person  Provider:   Kate Sable, MD   Other Instructions     Signed, Kate Sable, MD  03/02/2020 11:46 AM    [place]

## 2020-03-05 ENCOUNTER — Telehealth: Payer: Self-pay | Admitting: Cardiology

## 2020-03-05 DIAGNOSIS — R55 Syncope and collapse: Secondary | ICD-10-CM

## 2020-03-05 NOTE — Telephone Encounter (Signed)
  1. Is this related to a heart monitor you are wearing?  (If the patient says no, please ask     if they are caling about ICD/pacemaker.)  No   2. What is your issue??   Patient had zio applied in office on 2-28.  She had to call  Support line for issues with device not working and flashing orange and they mailed her a new one.   Patient having mri Wednesday and another brain test on 3-17  and needs to know what to do . She states she is nervous about        (If the patient is calling for results of the heart monitor this     message should be sent to nurse.)   Please route to covering RN/CMA/RMA for results. Route to monitor technicians or your monitor tech representative for your site for any technical concerns

## 2020-03-05 NOTE — Telephone Encounter (Signed)
Spoke with patient and advised her to apply her monitor when we get off the phone and wear it until her Mri on 03/11/20. This will be about 6 days.   Patient was nervous she would apply it wrong so we talked out the instructions together and she verbalized understanding and agreed with plan.

## 2020-03-11 ENCOUNTER — Other Ambulatory Visit: Payer: Self-pay

## 2020-03-11 ENCOUNTER — Ambulatory Visit
Admission: RE | Admit: 2020-03-11 | Discharge: 2020-03-11 | Disposition: A | Discharge status: Home / Self Care | Payer: Medicare PPO | Source: Ambulatory Visit | Attending: Neurology | Admitting: Neurology

## 2020-03-11 DIAGNOSIS — R4182 Altered mental status, unspecified: Secondary | ICD-10-CM | POA: Insufficient documentation

## 2020-03-19 ENCOUNTER — Telehealth: Payer: Self-pay | Admitting: Cardiology

## 2020-03-19 NOTE — Telephone Encounter (Signed)
Received [facility] Home Symptom Log. Patient documented while in restaurant experienced lightheadedness . Placed papers in Nurse box

## 2020-03-20 ENCOUNTER — Telehealth: Payer: Self-pay | Admitting: *Deleted

## 2020-03-20 NOTE — Telephone Encounter (Signed)
-----   Message from Kate Sable, MD sent at 03/18/2020  4:18 PM EDT ----- No significant arrhythmias noted to account for syncopal episode.  Overall benign cardiac monitor, one episode of SVT not associated with patient triggered events.

## 2020-03-20 NOTE — Telephone Encounter (Signed)
Reviewed results of monitor and she verbalized understanding with no further questions at this time.

## 2020-03-20 NOTE — Telephone Encounter (Signed)
Left voicemail message to call back for review of results.  

## 2020-03-20 NOTE — Telephone Encounter (Signed)
Received.   Patient had logged in her [place] an entry on 03/07/20 at 0900 that she felt light headed and forgot to press the button. She felt better after drinking water. Zio was unable to free text this into her report.

## 2020-03-25 ENCOUNTER — Ambulatory Visit (INDEPENDENT_AMBULATORY_CARE_PROVIDER_SITE_OTHER): Payer: Medicare PPO

## 2020-03-25 ENCOUNTER — Other Ambulatory Visit: Payer: Self-pay | Admitting: Cardiology

## 2020-03-25 ENCOUNTER — Other Ambulatory Visit: Payer: Self-pay

## 2020-03-25 DIAGNOSIS — R55 Syncope and collapse: Secondary | ICD-10-CM

## 2020-03-25 LAB — ECHOCARDIOGRAM COMPLETE
AR max vel: 2.45 cm2
AV Area VTI: 2.5 cm2
AV Area mean vel: 2.29 cm2
AV Mean grad: 2 mmHg
AV Peak grad: 3.9 mmHg
Ao pk vel: 0.98 m/s
Area-P 1/2: 4.12 cm2

## 2020-03-25 MED ORDER — PERFLUTREN LIPID MICROSPHERE
1.0000 mL | INTRAVENOUS | Status: AC | PRN
  Administered 2020-03-25: 2 mL via INTRAVENOUS

## 2020-03-26 DIAGNOSIS — R569 Unspecified convulsions: Secondary | ICD-10-CM | POA: Insufficient documentation

## 2020-04-06 ENCOUNTER — Ambulatory Visit: Payer: Medicare PPO | Admitting: Cardiology

## 2020-04-06 ENCOUNTER — Other Ambulatory Visit: Payer: Self-pay

## 2020-04-06 ENCOUNTER — Encounter: Payer: Self-pay | Admitting: Cardiology

## 2020-04-06 VITALS — BP 130/76 | HR 72 | Ht <= 58 in | Wt 108.0 lb

## 2020-04-06 DIAGNOSIS — E7800 Pure hypercholesterolemia, unspecified: Secondary | ICD-10-CM

## 2020-04-06 DIAGNOSIS — I10 Essential (primary) hypertension: Secondary | ICD-10-CM

## 2020-04-06 DIAGNOSIS — R55 Syncope and collapse: Secondary | ICD-10-CM

## 2020-04-06 NOTE — Patient Instructions (Signed)

## 2020-04-06 NOTE — Progress Notes (Signed)
Cardiology Office Note:    Date:  04/06/2020   ID:  Kerry Jordan, DOB 05-Feb-1945, MRN [id number]  PCP:  Dewayne Shorter, [place]  Cardiologist:  Kate Sable, MD  Advanced Practice Provider:  No care team member to display Electrophysiologist:  None       Referring MD: Dewayne Shorter, MD   Chief Complaint  Patient presents with  . Other    5 week follow up post ECHO and ZIO. Patient c/o lightheaded at about the same time each day wondering if it could be medication- Feels like high or low blood sugar but its not. Meds reviewed verbally with patient.     History of Present Illness:    Kerry Jordan is a 75 y.o. female with a hx of anxiety, diabetes, hyperlipidemia, hypertension presents for follow-up.  She was last seen due to syncope.  Her symptoms of syncope.  Vasovagal versus neurogenic since patient had seizure-like activity, and also bowel and urine incontinence.  Denies any symptoms of chest pain, shortness of breath, palpitations.  Echocardiogram ordered to evaluate cardiac function, and cardiac monitor was placed to evaluate any significant arrhythmias.  Also being followed by neurology.  No further episodes of syncope have occurred.  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  Patient is making plans to follow-up with endocrinology.  Prior notes She presented to the ED 11/2019 because her brother fell from a ladder and broke his leg.  While in the ED, she states being very stressed and anxious, felt dizzy and passed out.  She remembers feeling dizzy, telemetry staff members that she needs to lay down.  She apparently was incontinent to bowel and urine.  Denies chest pain, shortness of breath, palpitations.  She has not had any similar episodes since.  Previous episode of syncope was many years ago which was attributed to dehydration.  She was seen in the ED, given IV fluids, with resolution of symptoms.   Past Medical  History:  Diagnosis Date  . Anxiety   . Diabetes mellitus without complication ([place])   . Hypercholesteremia   . Hypertension     Past Surgical History:  Procedure Laterality Date  . BREAST BIOPSY Left 01/03/12   [place] bx/clip-neg  . COLONOSCOPY WITH PROPOFOL N/A 03/27/2017   Procedure: COLONOSCOPY WITH PROPOFOL;  Surgeon: Lollie Sails, MD;  Location: [facility] ENDOSCOPY;  Service: Endoscopy;  Laterality: N/A;  . FOOT SURGERY      Current Medications: Current Meds  Medication Sig  . busPIRone (BUSPAR) 7.5 MG tablet Take 7.5 mg by mouth daily.  . carvedilol (COREG) 6.25 MG tablet Take 6.25 mg by mouth 2 (two) times daily with a meal.  . hydrochlorothiazide (HYDRODIURIL) 25 MG tablet Take 25 mg by mouth daily.  Marland Kitchen lisinopril (PRINIVIL,ZESTRIL) 2.5 MG tablet Take 2.5 mg by mouth daily.  Marland Kitchen lovastatin (MEVACOR) 40 MG tablet Take 40 mg by mouth at bedtime.  . metFORMIN (GLUCOPHAGE) 500 MG tablet Take by mouth 2 (two) times daily with a meal.  . methotrexate 2.5 MG tablet Take 15 mg by mouth once a week.  . triamcinolone cream (KENALOG) 0.1 % Apply 1 application topically 2 (two) times daily.  . valACYclovir (VALTREX) 500 MG tablet Take 500 mg by mouth 2 (two) times daily.     Allergies:   Sulfa antibiotics   Social History   Socioeconomic History  . Marital status: Single    Spouse name: Not on file  .  Number of children: Not on file  . Years of education: Not on file  . Highest education Jordan: Not on file  Occupational History  . Not on file  Tobacco Use  . Smoking status: Never Smoker  . Smokeless tobacco: Never Used  Substance and Sexual Activity  . Alcohol use: Not Currently    Comment: occasional  . Drug use: No  . Sexual activity: Not on file  Other Topics Concern  . Not on file  Social History Narrative  . Not on file   Social Determinants of Health   Financial Resource Strain: Not on file  Food Insecurity: Not on file  Transportation Needs: Not on file   Physical Activity: Not on file  Stress: Not on file  Social Connections: Not on file     Family History: The patient's family history includes Blindness in her mother; Diabetes in her mother; Heart attack in her father; Heart failure in her mother. There is no history of Breast cancer.  ROS:   Please see the history of present illness.     All other systems reviewed and are negative.  EKGs/Labs/Other Studies Reviewed:    The following studies were reviewed today:   EKG:  EKG not  ordered today.   Recent Labs: 11/20/2019: BUN 29; Creatinine, Ser 0.56; Hemoglobin 14.5; Platelets 338; Potassium 3.1; Sodium 132  Recent Lipid Panel No results found for: CHOL, TRIG, HDL, CHOLHDL, VLDL, LDLCALC, LDLDIRECT   Risk Assessment/Calculations:      Physical Exam:    VS:  BP 130/76 (BP Location: Left Arm, Patient Position: Sitting, Cuff Size: Normal)   Pulse 72   Ht 4\' 9"  (1.448 m)   Wt 108 lb (49 kg)   SpO2 98%   BMI 23.37 kg/m     Wt Readings from Last 3 Encounters:  04/06/20 108 lb (49 kg)  03/02/20 108 lb 6 oz (49.2 kg)  11/20/19 112 lb (50.8 kg)     GEN:  Well nourished, well developed in no acute distress HEENT: Normal NECK: No JVD; No carotid bruits LYMPHATICS: No lymphadenopathy CARDIAC: RRR, no murmurs, rubs, gallops RESPIRATORY:  Clear to auscultation without rales, wheezing or rhonchi  ABDOMEN: Soft, non-tender, non-distended MUSCULOSKELETAL:  No edema; No deformity  SKIN: Warm and dry NEUROLOGIC:  Alert and oriented x 3 PSYCHIATRIC:  Normal affect   ASSESSMENT:    1. Syncope and collapse   2. Primary hypertension   3. Pure hypercholesterolemia    PLAN:    In order of problems listed above:  1. Patient with incident of syncope and collapse.  Could be neurologic, with questionable seizure, brain MRI showing pituitary abnormality.  Denies cardiac symptoms.  Echo shows normal systolic function, EF 60 to 65%, cardiac monitor with no significant  arrhythmias.  Patient made aware of results. 2. Hypertension, BP improved from last.  Continue current meds. 3. Hyperlipidemia, continue statin.   Follow-up as needed.    Medication Adjustments/Labs and Tests Ordered: Current medicines are reviewed at length with the patient today.  Concerns regarding medicines are outlined above.  No orders of the defined types were placed in this encounter.  No orders of the defined types were placed in this encounter.   Patient Instructions  Medication Instructions:  Your physician recommends that you continue on your current medications as directed. Please refer to the Current Medication list given to you today.  *If you need a refill on your cardiac medications before your next appointment, please call your pharmacy*  Lab Work: None ordered If you have labs (blood work) drawn today and your tests are completely normal, you will receive your results only by: Marland Kitchen MyChart Message (if you have MyChart) OR . A paper copy in the mail If you have any lab test that is abnormal or we need to change your treatment, we will call you to review the results.   Testing/Procedures: None ordered   Follow-Up: At [facility], you and your health needs are our priority.  As part of our continuing mission to provide you with exceptional heart care, we have created designated Provider Care Teams.  These Care Teams include your primary Cardiologist (physician) and Advanced Practice Providers (APPs -  Physician Assistants and Nurse Practitioners) who all work together to provide you with the care you need, when you need it.  We recommend signing up for the patient portal called "MyChart".  Sign up information is provided on this After Visit Summary.  MyChart is used to connect with patients for Virtual Visits (Telemedicine).  Patients are able to view lab/test results, encounter notes, upcoming appointments, etc.  Non-urgent messages can be sent to your provider  as well.   To learn more about what you can do with MyChart, go to [email or web address].    Your next appointment:   Follow up as needed   The format for your next appointment:   In Person  Provider:   Kate Sable, MD   Other Instructions      Signed, Kate Sable, MD  04/06/2020 4:58 PM    [place]

## 2020-05-22 DIAGNOSIS — R42 Dizziness and giddiness: Secondary | ICD-10-CM | POA: Insufficient documentation

## 2020-10-06 DIAGNOSIS — M0579 Rheumatoid arthritis with rheumatoid factor of multiple sites without organ or systems involvement: Secondary | ICD-10-CM | POA: Insufficient documentation

## 2020-10-16 ENCOUNTER — Other Ambulatory Visit: Payer: Self-pay | Admitting: Family Medicine

## 2020-10-20 ENCOUNTER — Telehealth: Payer: Self-pay | Admitting: Cardiology

## 2020-10-20 NOTE — Telephone Encounter (Signed)
Images and report scanned to RN

## 2020-10-20 NOTE — Telephone Encounter (Signed)
Patient dental office calling to let us know sifnificant calcification discovered BL Carotid during panoramic imaging at office.   Report and images to be emailed and placed in chart when received .

## 2020-10-21 NOTE — Telephone Encounter (Signed)
Patient scheduled for Friday 10-21

## 2020-10-23 ENCOUNTER — Other Ambulatory Visit: Payer: Self-pay

## 2020-10-23 ENCOUNTER — Encounter: Payer: Self-pay | Admitting: Cardiology

## 2020-10-23 ENCOUNTER — Ambulatory Visit: Payer: Medicare PPO | Admitting: Cardiology

## 2020-10-23 VITALS — BP 180/80 | HR 70 | Ht 59.0 in | Wt 109.0 lb

## 2020-10-23 DIAGNOSIS — I6523 Occlusion and stenosis of bilateral carotid arteries: Secondary | ICD-10-CM

## 2020-10-23 DIAGNOSIS — I10 Essential (primary) hypertension: Secondary | ICD-10-CM | POA: Diagnosis not present

## 2020-10-23 DIAGNOSIS — E7800 Pure hypercholesterolemia, unspecified: Secondary | ICD-10-CM | POA: Diagnosis not present

## 2020-10-23 MED ORDER — ASPIRIN EC 81 MG PO TBEC: 81.0000 mg | DELAYED_RELEASE_TABLET | Freq: Every day | ORAL | Status: DC

## 2020-10-23 NOTE — Progress Notes (Signed)
Cardiology Office Note:    Date:  10/23/2020   ID:  [name], DOB 12-12-1945, MRN [id number]  PCP:  Dewayne Shorter, [place]  Cardiologist:  Kate Sable, MD  Advanced Practice Provider:  No care team member to display Electrophysiologist:  None       Referring MD: Dewayne Shorter, MD   Chief Complaint  Patient presents with   F/U after dental visit-calcification in carotid arteries    Found on panoramic xray    History of Present Illness:    Kerry Jordan is a 75 y.o. female with a hx of anxiety, diabetes, hyperlipidemia, hypertension presents due to coronary artery calcification.  Patient was having some of dentist, cervical x-ray was obtained, bilateral calcifications noted in the carotid arteries.  Denies any history of dizziness or strokelike symptoms.  Blood pressures are usually elevated when patient is anxious.   Prior notes She presented to the ED 11/2019 because her brother fell from a ladder and broke his leg.  While in the ED, she states being very stressed and anxious, felt dizzy and passed out.  She remembers feeling dizzy, telemetry staff members that she needs to lay down.  She apparently was incontinent to bowel and urine.  Denies chest pain, shortness of breath, palpitations.  She has not had any similar episodes since.  Previous episode of syncope was many years ago which was attributed to dehydration.  She was seen in the ED, given IV fluids, with resolution of symptoms.   Past Medical History:  Diagnosis Date   Anxiety    Diabetes mellitus without complication ([place])    Hypercholesteremia    Hypertension     Past Surgical History:  Procedure Laterality Date   BREAST BIOPSY Left 01/03/12   [place] bx/clip-neg   COLONOSCOPY WITH PROPOFOL N/A 03/27/2017   Procedure: COLONOSCOPY WITH PROPOFOL;  Surgeon: Lollie Sails, MD;  Location: [facility] ENDOSCOPY;  Service: Endoscopy;  Laterality: N/A;   FOOT SURGERY       Current Medications: Current Meds  Medication Sig   aspirin EC 81 MG tablet Take 1 tablet (81 mg total) by mouth daily. Swallow whole.   atorvastatin (LIPITOR) 40 MG tablet Take 40 mg by mouth daily.   busPIRone (BUSPAR) 7.5 MG tablet Take 7.5 mg by mouth daily.   carvedilol (COREG) 6.25 MG tablet Take 6.25 mg by mouth 2 (two) times daily with a meal.   hydrochlorothiazide (HYDRODIURIL) 25 MG tablet Take 25 mg by mouth daily.   lisinopril (PRINIVIL,ZESTRIL) 2.5 MG tablet Take 2.5 mg by mouth daily.   metFORMIN (GLUCOPHAGE) 500 MG tablet Take by mouth 2 (two) times daily with a meal.   methotrexate (RHEUMATREX) 2.5 MG tablet Take 17.5 mg by mouth once a week.   triamcinolone cream (KENALOG) 0.1 % Apply 1 application topically 2 (two) times daily.   valACYclovir (VALTREX) 500 MG tablet Take 500 mg by mouth 2 (two) times daily.     Allergies:   Sulfa antibiotics   Social History   Socioeconomic History   Marital status: Single    Spouse name: Not on file   Number of children: Not on file   Years of education: Not on file   Highest education level: Not on file  Occupational History   Not on file  Tobacco Use   Smoking status: Never   Smokeless tobacco: Never  Vaping Use   Vaping Use: Never used  Substance and Sexual Activity  Alcohol use: Not Currently    Comment: occasional wine-a few glasses per year or one mixed drink per year   Drug use: No   Sexual activity: Not on file  Other Topics Concern   Not on file  Social History Narrative   Not on file   Social Determinants of Health   Financial Resource Strain: Not on file  Food Insecurity: Not on file  Transportation Needs: Not on file  Physical Activity: Not on file  Stress: Not on file  Social Connections: Not on file     Family History: The patient's family history includes Blindness in her mother; Diabetes in her mother; Heart attack in her father; Heart failure in her mother. There is no history of Breast  cancer.  ROS:   Please see the history of present illness.     All other systems reviewed and are negative.  EKGs/Labs/Other Studies Reviewed:    The following studies were reviewed today:   EKG:  EKG is ordered today.  EKG shows normal sinus rhythm  Recent Labs: 11/20/2019: BUN 29; Creatinine, Ser 0.56; Hemoglobin 14.5; Platelets 338; Potassium 3.1; Sodium 132  Recent Lipid Panel No results found for: CHOL, TRIG, HDL, CHOLHDL, VLDL, LDLCALC, LDLDIRECT   Risk Assessment/Calculations:      Physical Exam:    VS:  BP (!) 180/80 (BP Location: Left Arm, Patient Position: Sitting, Cuff Size: Normal)   Pulse 70   Ht 4\' 11"  (1.499 m)   Wt 109 lb (49.4 kg)   SpO2 98%   BMI 22.02 kg/m     Wt Readings from Last 3 Encounters:  10/23/20 109 lb (49.4 kg)  04/06/20 108 lb (49 kg)  03/02/20 108 lb 6 oz (49.2 kg)     GEN:  Well nourished, well developed in no acute distress HEENT: Normal NECK: No JVD; No carotid bruits LYMPHATICS: No lymphadenopathy CARDIAC: RRR, no murmurs, rubs, gallops RESPIRATORY:  Clear to auscultation without rales, wheezing or rhonchi  ABDOMEN: Soft, non-tender, non-distended MUSCULOSKELETAL:  No edema; No deformity  SKIN: Warm and dry NEUROLOGIC:  Alert and oriented x 3 PSYCHIATRIC:  Normal affect   ASSESSMENT:    1. Calcification of both carotid arteries   2. Primary hypertension   3. Pure hypercholesterolemia     PLAN:    In order of problems listed above:  Cervical x-ray with bilateral carotid artery calcifications.  Start aspirin 81 mg daily, continue Lipitor 40 mg.  Obtain carotid artery ultrasound.  Refer patient to vascular surgery for additional input.  Last echo showed normal systolic function, EF 60 to 65%. Hypertension, BP elevated, usually controlled.  continue Coreg, lisinopril, HCTZ. Hyperlipidemia, continue statin.   Follow-up in 6 months    Medication Adjustments/Labs and Tests Ordered: Current medicines are reviewed at  length with the patient today.  Concerns regarding medicines are outlined above.  Orders Placed This Encounter  Procedures   Ambulatory referral to Vascular Surgery   EKG 12-Lead   VAS US CAROTID    Meds ordered this encounter  Medications   aspirin EC 81 MG tablet    Sig: Take 1 tablet (81 mg total) by mouth daily. Swallow whole.     Patient Instructions  Medication Instructions:  Your physician has recommended you make the following change in your medication:   START Aspirin 81 mg daily. This can be purchased over the counter.  *If you need a refill on your cardiac medications before your next appointment, please call your pharmacy*   Lab  Work: None ordered If you have labs (blood work) drawn today and your tests are completely normal, you will receive your results only by: [place] (if you have MyChart) OR A paper copy in the mail If you have any lab test that is abnormal or we need to change your treatment, we will call you to review the results.   Testing/Procedures: Your physician has requested that you have a carotid duplex. This test is an ultrasound of the carotid arteries in your neck. It looks at blood flow through these arteries that supply the brain with blood. Allow one hour for this exam. There are no restrictions or special instructions.    Follow-Up: At [facility], you and your health needs are our priority.  As part of our continuing mission to provide you with exceptional heart care, we have created designated Provider Care Teams.  These Care Teams include your primary Cardiologist (physician) and Advanced Practice Providers (APPs -  Physician Assistants and Nurse Practitioners) who all work together to provide you with the care you need, when you need it.  We recommend signing up for the patient portal called "MyChart".  Sign up information is provided on this After Visit Summary.  MyChart is used to connect with patients for Virtual Visits  (Telemedicine).  Patients are able to view lab/test results, encounter notes, upcoming appointments, etc.  Non-urgent messages can be sent to your provider as well.   To learn more about what you can do with MyChart, go to [email or web address].    Your next appointment:   Your physician wants you to follow-up in: 6 months You will receive a reminder letter in the mail two months in advance. If you don't receive a letter, please call our office to schedule the follow-up appointment.   The format for your next appointment:   In Person  Provider:   You may see Kate Sable, MD or one of the following Advanced Practice Providers on your designated Care Team:   Murray Hodgkins, NP Christell Faith, PA-C Marrianne Mood, PA-C Cadence Kathlen Mody, [place]   Other Instructions You have been referred to [facility]. Their office will call you to schedule the appt.     Signed, Kate Sable, MD  10/23/2020 5:06 PM    [facility]

## 2020-10-23 NOTE — Patient Instructions (Signed)
Medication Instructions:  Your physician has recommended you make the following change in your medication:   START Aspirin 81 mg daily. This can be purchased over the counter.  *If you need a refill on your cardiac medications before your next appointment, please call your pharmacy*   Lab Work: None ordered If you have labs (blood work) drawn today and your tests are completely normal, you will receive your results only by: [place] (if you have MyChart) OR A paper copy in the mail If you have any lab test that is abnormal or we need to change your treatment, we will call you to review the results.   Testing/Procedures: Your physician has requested that you have a carotid duplex. This test is an ultrasound of the carotid arteries in your neck. It looks at blood flow through these arteries that supply the brain with blood. Allow one hour for this exam. There are no restrictions or special instructions.    Follow-Up: At [facility], you and your health needs are our priority.  As part of our continuing mission to provide you with exceptional heart care, we have created designated Provider Care Teams.  These Care Teams include your primary Cardiologist (physician) and Advanced Practice Providers (APPs -  Physician Assistants and Nurse Practitioners) who all work together to provide you with the care you need, when you need it.  We recommend signing up for the patient portal called "MyChart".  Sign up information is provided on this After Visit Summary.  MyChart is used to connect with patients for Virtual Visits (Telemedicine).  Patients are able to view lab/test results, encounter notes, upcoming appointments, etc.  Non-urgent messages can be sent to your provider as well.   To learn more about what you can do with MyChart, go to [email or web address].    Your next appointment:   Your physician wants you to follow-up in: 6 months You will receive a reminder letter in the mail  two months in advance. If you don't receive a letter, please call our office to schedule the follow-up appointment.   The format for your next appointment:   In Person  Provider:   You may see Kate Sable, MD or one of the following Advanced Practice Providers on your designated Care Team:   Murray Hodgkins, NP Christell Faith, PA-C Marrianne Mood, PA-C Cadence Kathlen Mody, [place]   Other Instructions You have been referred to [facility]. Their office will call you to schedule the appt.

## 2020-11-09 ENCOUNTER — Other Ambulatory Visit (HOSPITAL_COMMUNITY): Payer: Self-pay | Admitting: "Endocrinology

## 2020-11-09 ENCOUNTER — Other Ambulatory Visit: Payer: Self-pay | Admitting: "Endocrinology

## 2020-11-09 DIAGNOSIS — D497 Neoplasm of unspecified behavior of endocrine glands and other parts of nervous system: Secondary | ICD-10-CM

## 2020-11-18 ENCOUNTER — Ambulatory Visit: Payer: Medicare PPO

## 2020-11-20 ENCOUNTER — Other Ambulatory Visit: Payer: Self-pay | Admitting: Family Medicine

## 2020-11-20 ENCOUNTER — Other Ambulatory Visit: Payer: Self-pay | Admitting: Obstetrics & Gynecology

## 2020-11-20 DIAGNOSIS — Z1231 Encounter for screening mammogram for malignant neoplasm of breast: Secondary | ICD-10-CM

## 2020-11-24 ENCOUNTER — Other Ambulatory Visit: Payer: Self-pay

## 2020-11-24 ENCOUNTER — Ambulatory Visit
Admission: RE | Admit: 2020-11-24 | Discharge: 2020-11-24 | Disposition: A | Discharge status: Home / Self Care | Payer: Medicare PPO | Source: Ambulatory Visit | Attending: Family Medicine | Admitting: Family Medicine

## 2020-11-24 DIAGNOSIS — Z1231 Encounter for screening mammogram for malignant neoplasm of breast: Secondary | ICD-10-CM | POA: Insufficient documentation

## 2020-11-30 ENCOUNTER — Ambulatory Visit
Admission: RE | Admit: 2020-11-30 | Discharge: 2020-11-30 | Disposition: A | Discharge status: Home / Self Care | Payer: Medicare PPO | Source: Ambulatory Visit | Attending: "Endocrinology | Admitting: "Endocrinology

## 2020-11-30 ENCOUNTER — Other Ambulatory Visit: Payer: Self-pay

## 2020-11-30 DIAGNOSIS — D497 Neoplasm of unspecified behavior of endocrine glands and other parts of nervous system: Secondary | ICD-10-CM | POA: Insufficient documentation

## 2020-11-30 MED ORDER — GADOBUTROL 1 MMOL/ML IV SOLN
5.0000 mL | Freq: Once | INTRAVENOUS | Status: AC | PRN
  Administered 2020-11-30: 15:00:00 5 mL via INTRAVENOUS

## 2020-12-01 ENCOUNTER — Other Ambulatory Visit (INDEPENDENT_AMBULATORY_CARE_PROVIDER_SITE_OTHER): Payer: Self-pay | Admitting: Nurse Practitioner

## 2020-12-01 DIAGNOSIS — I6529 Occlusion and stenosis of unspecified carotid artery: Secondary | ICD-10-CM

## 2020-12-02 ENCOUNTER — Other Ambulatory Visit: Payer: Self-pay

## 2020-12-02 ENCOUNTER — Ambulatory Visit (INDEPENDENT_AMBULATORY_CARE_PROVIDER_SITE_OTHER): Payer: Medicare PPO | Admitting: Nurse Practitioner

## 2020-12-02 ENCOUNTER — Ambulatory Visit (INDEPENDENT_AMBULATORY_CARE_PROVIDER_SITE_OTHER): Payer: Medicare PPO

## 2020-12-02 VITALS — BP 180/84 | HR 72 | Ht <= 58 in | Wt 108.0 lb

## 2020-12-02 DIAGNOSIS — I6529 Occlusion and stenosis of unspecified carotid artery: Secondary | ICD-10-CM

## 2020-12-02 DIAGNOSIS — Z634 Disappearance and death of family member: Secondary | ICD-10-CM | POA: Insufficient documentation

## 2020-12-02 DIAGNOSIS — F419 Anxiety disorder, unspecified: Secondary | ICD-10-CM | POA: Insufficient documentation

## 2020-12-08 ENCOUNTER — Encounter (INDEPENDENT_AMBULATORY_CARE_PROVIDER_SITE_OTHER): Payer: Self-pay | Admitting: Nurse Practitioner

## 2020-12-14 NOTE — Progress Notes (Signed)
Subjective:    Patient ID: Kerry Jordan, female    DOB: 08-Mar-1945, 75 y.o.   MRN: [id number] Chief Complaint  Patient presents with   New Patient (Initial Visit)    Np carotid + consult carotid artery atherosclerosis     Kerry Jordan is a 75 year old female that presents today for evaluation of carotid stenosis after recent dentist appointment.  She had x-rays done which showed some carotid atherosclerosis.  The patient denies any TIA or amaurosis fugax-like symptoms.  She denies any recent stroke.  She does have a family history of atherosclerotic events.  The patient is also diabetic as well as a history of hypertension.  She has no claudication-like symptoms or rest pain like symptoms.  Today noninvasive studies show 1 to 39% stenosis of the left ICA.  The right ICA shows no evidence of stenosis.  Bilateral vertebral arteries have antegrade flow with normal flow hemodynamics in the subclavian arteries.   Review of Systems  Neurological:  Negative for dizziness, facial asymmetry, speech difficulty and light-headedness.  Psychiatric/Behavioral:  The patient is nervous/anxious.   All other systems reviewed and are negative.     Objective:   Physical Exam Vitals reviewed.  HENT:     Head: Normocephalic.  Neck:     Vascular: No carotid bruit.  Cardiovascular:     Rate and Rhythm: Normal rate.     Pulses: Normal pulses.  Pulmonary:     Effort: Pulmonary effort is normal.  Skin:    General: Skin is warm and dry.  Neurological:     Mental Status: She is alert and oriented to person, place, and time.  Psychiatric:        Mood and Affect: Mood normal.        Behavior: Behavior normal.        Thought Content: Thought content normal.        Judgment: Judgment normal.    BP (!) 180/84   Pulse 72   Ht 4\' 9"  (1.448 m)   Wt 108 lb (49 kg)   BMI 23.37 kg/m   Past Medical History:  Diagnosis Date   Anxiety    Diabetes mellitus without complication (HCC)     Hypercholesteremia    Hypertension     Social History   Socioeconomic History   Marital status: Single    Spouse name: Not on file   Number of children: Not on file   Years of education: Not on file   Highest education Jordan: Not on file  Occupational History   Not on file  Tobacco Use   Smoking status: Never   Smokeless tobacco: Never  Vaping Use   Vaping Use: Never used  Substance and Sexual Activity   Alcohol use: Not Currently    Comment: occasional wine-a few glasses per year or one mixed drink per year   Drug use: No   Sexual activity: Not on file  Other Topics Concern   Not on file  Social History Narrative   Not on file   Social Determinants of Health   Financial Resource Strain: Not on file  Food Insecurity: Not on file  Transportation Needs: Not on file  Physical Activity: Not on file  Stress: Not on file  Social Connections: Not on file  Intimate Partner Violence: Not on file    Past Surgical History:  Procedure Laterality Date   BREAST BIOPSY Left 01/03/12   [place] bx/clip-neg   COLONOSCOPY WITH PROPOFOL N/A 03/27/2017   Procedure:  COLONOSCOPY WITH PROPOFOL;  Surgeon: Lollie Sails, MD;  Location: [facility] ENDOSCOPY;  Service: Endoscopy;  Laterality: N/A;   FOOT SURGERY      Family History  Problem Relation Age of Onset   Blindness Mother    Diabetes Mother    Heart failure Mother    Heart attack Father    Breast cancer Neg Hx     Allergies  Allergen Reactions   Sulfa Antibiotics Nausea And Vomiting    CBC Latest Ref Rng & Units 11/20/2019 05/17/2016 02/08/2015  WBC 4.0 - 10.5 K/uL 10.0 9.3 8.3  Hemoglobin 12.0 - 15.0 g/dL 14.5 14.3 14.4  Hematocrit 36.0 - 46.0 % 40.6 41.8 42.1  Platelets 150 - 400 K/uL 338 230 241      CMP     Component Value Date/Time   NA 132 (L) 11/20/2019 1412   K 3.1 (L) 11/20/2019 1412   CL 98 11/20/2019 1412   CO2 18 (L) 11/20/2019 1412   GLUCOSE 138 (H) 11/20/2019 1412   BUN 29 (H) 11/20/2019 1412    CREATININE 0.56 11/20/2019 1412   CALCIUM 9.0 11/20/2019 1412   GFRNONAA >60 11/20/2019 1412   GFRAA >60 05/17/2016 0227     No results found.     Assessment & Plan:   1. Carotid atherosclerosis, unspecified laterality Recommend:  Given the patient's asymptomatic subcritical stenosis no further invasive testing or surgery at this time.  Duplex ultrasound shows 1 to 39% left ICA stenosis  Continue antiplatelet therapy as prescribed Continue management of CAD, HTN and Hyperlipidemia Healthy heart diet,  encouraged exercise at least 4 times per week Follow up in 12 months with duplex ultrasound and physical exam     Current Outpatient Medications on File Prior to Visit  Medication Sig Dispense Refill   aspirin EC 81 MG tablet Take 1 tablet (81 mg total) by mouth daily. Swallow whole.     atorvastatin (LIPITOR) 40 MG tablet Take 40 mg by mouth daily.     busPIRone (BUSPAR) 7.5 MG tablet Take 7.5 mg by mouth daily.     carvedilol (COREG) 6.25 MG tablet Take 6.25 mg by mouth 2 (two) times daily with a meal.     hydrochlorothiazide (HYDRODIURIL) 25 MG tablet Take 25 mg by mouth daily.     lisinopril (PRINIVIL,ZESTRIL) 2.5 MG tablet Take 2.5 mg by mouth daily.     metFORMIN (GLUCOPHAGE) 500 MG tablet Take by mouth 2 (two) times daily with a meal.     methotrexate (RHEUMATREX) 2.5 MG tablet Take 17.5 mg by mouth once a week.     triamcinolone cream (KENALOG) 0.1 % Apply 1 application topically 2 (two) times daily.     valACYclovir (VALTREX) 500 MG tablet Take 500 mg by mouth 2 (two) times daily.     No current facility-administered medications on file prior to visit.    There are no Patient Instructions on file for this visit. No follow-ups on file.   Kris Hartmann, NP

## 2021-04-29 ENCOUNTER — Ambulatory Visit: Payer: Medicare PPO | Admitting: Cardiology

## 2021-04-29 ENCOUNTER — Encounter: Payer: Self-pay | Admitting: Cardiology

## 2021-04-29 VITALS — BP 120/66 | HR 81 | Ht <= 58 in | Wt 111.0 lb

## 2021-04-29 DIAGNOSIS — I10 Essential (primary) hypertension: Secondary | ICD-10-CM

## 2021-04-29 DIAGNOSIS — I6523 Occlusion and stenosis of bilateral carotid arteries: Secondary | ICD-10-CM

## 2021-04-29 DIAGNOSIS — E7800 Pure hypercholesterolemia, unspecified: Secondary | ICD-10-CM | POA: Diagnosis not present

## 2021-04-29 NOTE — Progress Notes (Signed)
?Cardiology Office Note:   ? ?Date:  04/29/2021  ? ?ID:  Kerry Jordan, DOB 05/08/1945, MRN [id number] ? ?PCP:  Erma Heritage, MD ?  ?[place]  ?Cardiologist:  Kate Sable, MD  ?Advanced Practice Provider:  No care team member to display ?Electrophysiologist:  None  ?    ? ?Referring MD: Erma Heritage, MD  ? ?Chief Complaint  ?Patient presents with  ? Other  ?  6 month follow up -- Meds reviewed verbally with patient.   ? ? ?History of Present Illness:   ? ?Kerry Jordan is a 76 y.o. female with a hx of anxiety, diabetes, hyperlipidemia, hypertension presents for follow-up.   ? ?Previously seen due to coronary artery calcification noted on a dental x-ray.  Carotid ultrasound was obtained, referral placed to vascular surgery.  Ultrasound did not show any significant obstructive carotid artery disease.  Yearly follow-up with vascular surgery recommended.  She states feeling okay, started on losartan, dose increased to 50 mg by PCP.  BP has been well controlled, tolerating medications without any adverse effects. ? ? ?Prior notes ?Echo 03/2020 EF 60 to 65% ? ?She presented to the ED 11/2019 because her brother fell from a ladder and broke his leg.  While in the ED, she states being very stressed and anxious, felt dizzy and passed out.  She remembers feeling dizzy, telemetry staff members that she needs to lay down.  She apparently was incontinent to bowel and urine.  Denies chest pain, shortness of breath, palpitations.  She has not had any similar episodes since.  Previous episode of syncope was many years ago which was attributed to dehydration.  She was seen in the ED, given IV fluids, with resolution of symptoms. ? ? ?Past Medical History:  ?Diagnosis Date  ? Anxiety   ? Diabetes mellitus without complication ([place])   ? Hypercholesteremia   ? Hypertension   ? ? ?Past Surgical History:  ?Procedure Laterality Date  ? BREAST BIOPSY Left 01/03/12  ? [place] bx/clip-neg  ?  COLONOSCOPY WITH PROPOFOL N/A 03/27/2017  ? Procedure: COLONOSCOPY WITH PROPOFOL;  Surgeon: Lollie Sails, MD;  Location: [facility] ENDOSCOPY;  Service: Endoscopy;  Laterality: N/A;  ? FOOT SURGERY    ? ? ?Current Medications: ?Current Meds  ?Medication Sig  ? aspirin EC 81 MG tablet Take 1 tablet (81 mg total) by mouth daily. Swallow whole.  ? atorvastatin (LIPITOR) 40 MG tablet Take 40 mg by mouth daily.  ? busPIRone (BUSPAR) 10 MG tablet Take 10 mg by mouth daily.  ? carvedilol (COREG) 6.25 MG tablet Take 6.25 mg by mouth 2 (two) times daily with a meal.  ? hydrochlorothiazide (HYDRODIURIL) 25 MG tablet Take 25 mg by mouth daily.  ? losartan (COZAAR) 50 MG tablet Take 50 mg by mouth daily.  ? metFORMIN (GLUCOPHAGE) 500 MG tablet Take by mouth 2 (two) times daily with a meal.  ? methotrexate (RHEUMATREX) 2.5 MG tablet Take 17.5 mg by mouth once a week.  ? triamcinolone cream (KENALOG) 0.1 % Apply 1 application topically 2 (two) times daily.  ? valACYclovir (VALTREX) 500 MG tablet Take 500 mg by mouth 2 (two) times daily.  ?  ? ?Allergies:   Sulfa antibiotics  ? ?Social History  ? ?Socioeconomic History  ? Marital status: Single  ?  Spouse name: Not on file  ? Number of children: Not on file  ? Years of education: Not on file  ? Highest education level:  Not on file  ?Occupational History  ? Not on file  ?Tobacco Use  ? Smoking status: Never  ? Smokeless tobacco: Never  ?Vaping Use  ? Vaping Use: Never used  ?Substance and Sexual Activity  ? Alcohol use: Not Currently  ?  Comment: occasional wine-a few glasses per year or one mixed drink per year  ? Drug use: No  ? Sexual activity: Not on file  ?Other Topics Concern  ? Not on file  ?Social History Narrative  ? Not on file  ? ?Social Determinants of Health  ? ?Financial Resource Strain: Not on file  ?Food Insecurity: Not on file  ?Transportation Needs: Not on file  ?Physical Activity: Not on file  ?Stress: Not on file  ?Social Connections: Not on file  ?  ? ?Family  History: ?The patient's family history includes Blindness in her mother; Diabetes in her mother; Heart attack in her father; Heart failure in her mother. There is no history of Breast cancer. ? ?ROS:   ?Please see the history of present illness.    ? All other systems reviewed and are negative. ? ?EKGs/Labs/Other Studies Reviewed:   ? ?The following studies were reviewed today: ? ? ?EKG:  EKG is ordered today.  EKG shows normal sinus rhythm ? ?Recent Labs: ?No results found for requested labs within last 8760 hours.  ?Recent Lipid Panel ?No results found for: CHOL, TRIG, HDL, CHOLHDL, VLDL, LDLCALC, LDLDIRECT ? ? ?Risk Assessment/Calculations:   ? ? ? ?Physical Exam:   ? ?VS:  BP 120/66 (BP Location: Left Arm, Patient Position: Sitting, Cuff Size: Normal)   Pulse 81   Ht '4\' 10"'$  (1.473 m)   Wt 111 lb (50.3 kg)   SpO2 97%   BMI 23.20 kg/m?    ? ?Wt Readings from Last 3 Encounters:  ?04/29/21 111 lb (50.3 kg)  ?12/02/20 108 lb (49 kg)  ?10/23/20 109 lb (49.4 kg)  ?  ? ?GEN:  Well nourished, well developed in no acute distress ?HEENT: Normal ?NECK: No JVD; No carotid bruits ?LYMPHATICS: No lymphadenopathy ?CARDIAC: RRR, no murmurs, rubs, gallops ?RESPIRATORY:  Clear to auscultation without rales, wheezing or rhonchi  ?ABDOMEN: Soft, non-tender, non-distended ?MUSCULOSKELETAL:  No edema; No deformity  ?SKIN: Warm and dry ?NEUROLOGIC:  Alert and oriented x 3 ?PSYCHIATRIC:  Normal affect  ? ?ASSESSMENT:   ? ?1. Calcification of both carotid arteries   ?2. Primary hypertension   ?3. Pure hypercholesterolemia   ? ? ? ?PLAN:   ? ?In order of problems listed above: ? ?Cervical x-ray with bilateral carotid artery calcifications.  Carotid ultrasound with minimal left ICA stenosis 1 to 39%.  No stenosis in the right RCA.  Continue aspirin 81 mg daily,  Lipitor 40 mg. echo 03/2020  EF 60 to 65%.  Follow-up appointments and monitoring as per vascular surgery team ?Hypertension, BP controlled.  continue Coreg, HCTZ,  losartan. ?Hyperlipidemia, obtain fasting lipid profile , continue statin. ? ?Follow-up yearly ? ? ? ?Medication Adjustments/Labs and Tests Ordered: ?Current medicines are reviewed at length with the patient today.  Concerns regarding medicines are outlined above.  ?Orders Placed This Encounter  ?Procedures  ? Lipid panel  ? EKG 12-Lead  ? ? ?No orders of the defined types were placed in this encounter. ? ? ? ?Patient Instructions  ?Medication Instructions:  ?Your physician recommends that you continue on your current medications as directed. Please refer to the Current Medication list given to you today. ? ?*If you need a refill  on your cardiac medications before your next appointment, please call your pharmacy* ? ? ?Lab Work: ? ?Your physician recommends that you return for a FASTING lipid profile: At your earliest convenience ? ?- You will need to be fasting. Please do not have anything to eat or drink after midnight the morning you have the lab work. You may only have water or black coffee with no cream or sugar.  ? ?- Please go to the [facility]. You will check in at the front desk to the right as you walk into the atrium. Valet Parking is offered if needed. ?- No appointment needed. You may go any day between 7 am and 6 pm.  ? ? ? ?Testing/Procedures: ?None ordered ? ? ?Follow-Up: ?At [facility], you and your health needs are our priority.  As part of our continuing mission to provide you with exceptional heart care, we have created designated Provider Care Teams.  These Care Teams include your primary Cardiologist (physician) and Advanced Practice Providers (APPs -  Physician Assistants and Nurse Practitioners) who all work together to provide you with the care you need, when you need it. ? ?We recommend signing up for the patient portal called "MyChart".  Sign up information is provided on this After Visit Summary.  MyChart is used to connect with patients for Virtual Visits (Telemedicine).   Patients are able to view lab/test results, encounter notes, upcoming appointments, etc.  Non-urgent messages can be sent to your provider as well.   ?To learn more about what you can do with MyChart, go to [email or web address].

## 2021-04-29 NOTE — Patient Instructions (Addendum)
Medication Instructions:  ?Your physician recommends that you continue on your current medications as directed. Please refer to the Current Medication list given to you today. ? ?*If you need a refill on your cardiac medications before your next appointment, please call your pharmacy* ? ? ?Lab Work: ? ?Your physician recommends that you return for a FASTING lipid profile: At your earliest convenience ? ?- You will need to be fasting. Please do not have anything to eat or drink after midnight the morning you have the lab work. You may only have water or black coffee with no cream or sugar.  ? ?- Please go to the [facility]. You will check in at the front desk to the right as you walk into the atrium. Valet Parking is offered if needed. ?- No appointment needed. You may go any day between 7 am and 6 pm.  ? ? ? ?Testing/Procedures: ?None ordered ? ? ?Follow-Up: ?At [facility], you and your health needs are our priority.  As part of our continuing mission to provide you with exceptional heart care, we have created designated Provider Care Teams.  These Care Teams include your primary Cardiologist (physician) and Advanced Practice Providers (APPs -  Physician Assistants and Nurse Practitioners) who all work together to provide you with the care you need, when you need it. ? ?We recommend signing up for the patient portal called "MyChart".  Sign up information is provided on this After Visit Summary.  MyChart is used to connect with patients for Virtual Visits (Telemedicine).  Patients are able to view lab/test results, encounter notes, upcoming appointments, etc.  Non-urgent messages can be sent to your provider as well.   ?To learn more about what you can do with MyChart, go to [email or web address].   ? ?Your next appointment:   ?1 year(s) ? ?The format for your next appointment:   ?In Person ? ?Provider:   ?You may see Kate Sable, MD or one of the following Advanced Practice Providers on your  designated Care Team:   ?Murray Hodgkins, NP ?Christell Faith, PA-C ?Cadence Kathlen Mody, PA-C  ? ? ?Other Instructions ? ? ?Important Information About Sugar ? ? ? ? ? ? ?

## 2021-05-18 ENCOUNTER — Other Ambulatory Visit
Admission: RE | Admit: 2021-05-18 | Discharge: 2021-05-18 | Disposition: A | Discharge status: Home / Self Care | Payer: Medicare PPO | Source: Ambulatory Visit | Attending: Cardiology | Admitting: Cardiology

## 2021-05-18 DIAGNOSIS — E7800 Pure hypercholesterolemia, unspecified: Secondary | ICD-10-CM | POA: Insufficient documentation

## 2021-05-18 LAB — LIPID PANEL
Cholesterol: 164 mg/dL (ref 0–200)
HDL: 58 mg/dL (ref >40)
LDL Cholesterol: 96 mg/dL (ref 0–99)
Total CHOL/HDL Ratio: 2.8 RATIO
Triglycerides: 51 mg/dL (ref <150)
VLDL: 10 mg/dL (ref 0–40)

## 2022-03-17 ENCOUNTER — Other Ambulatory Visit: Payer: Self-pay | Admitting: Family Medicine

## 2022-03-17 DIAGNOSIS — Z1231 Encounter for screening mammogram for malignant neoplasm of breast: Secondary | ICD-10-CM

## 2022-03-21 ENCOUNTER — Ambulatory Visit
Admission: RE | Admit: 2022-03-21 | Discharge: 2022-03-21 | Disposition: A | Discharge status: Home / Self Care | Payer: Medicare PPO | Source: Ambulatory Visit | Attending: Family Medicine | Admitting: Family Medicine

## 2022-03-21 DIAGNOSIS — Z1231 Encounter for screening mammogram for malignant neoplasm of breast: Secondary | ICD-10-CM | POA: Insufficient documentation

## 2022-03-22 ENCOUNTER — Encounter: Payer: Self-pay | Admitting: Family Medicine

## 2022-03-25 ENCOUNTER — Other Ambulatory Visit: Payer: Self-pay | Admitting: Obstetrics and Gynecology

## 2022-03-25 DIAGNOSIS — R928 Other abnormal and inconclusive findings on diagnostic imaging of breast: Secondary | ICD-10-CM

## 2022-03-25 DIAGNOSIS — N6489 Other specified disorders of breast: Secondary | ICD-10-CM

## 2022-03-29 ENCOUNTER — Ambulatory Visit
Admission: RE | Admit: 2022-03-29 | Discharge: 2022-03-29 | Disposition: A | Discharge status: Home / Self Care | Payer: Medicare PPO | Source: Ambulatory Visit | Attending: Obstetrics and Gynecology | Admitting: Obstetrics and Gynecology

## 2022-03-29 DIAGNOSIS — N6489 Other specified disorders of breast: Secondary | ICD-10-CM | POA: Diagnosis present

## 2022-03-29 DIAGNOSIS — R928 Other abnormal and inconclusive findings on diagnostic imaging of breast: Secondary | ICD-10-CM

## 2022-06-16 ENCOUNTER — Other Ambulatory Visit: Payer: Self-pay | Admitting: Student

## 2022-11-21 ENCOUNTER — Ambulatory Visit: Payer: Medicare PPO | Admitting: Student

## 2022-11-28 ENCOUNTER — Other Ambulatory Visit (INDEPENDENT_AMBULATORY_CARE_PROVIDER_SITE_OTHER): Payer: Self-pay | Admitting: Nurse Practitioner

## 2022-11-28 DIAGNOSIS — I6529 Occlusion and stenosis of unspecified carotid artery: Secondary | ICD-10-CM

## 2022-12-05 ENCOUNTER — Ambulatory Visit (INDEPENDENT_AMBULATORY_CARE_PROVIDER_SITE_OTHER): Payer: Medicare PPO

## 2022-12-05 DIAGNOSIS — I6529 Occlusion and stenosis of unspecified carotid artery: Secondary | ICD-10-CM | POA: Diagnosis not present

## 2022-12-07 ENCOUNTER — Encounter (INDEPENDENT_AMBULATORY_CARE_PROVIDER_SITE_OTHER): Payer: Self-pay | Admitting: Nurse Practitioner

## 2022-12-07 ENCOUNTER — Ambulatory Visit (INDEPENDENT_AMBULATORY_CARE_PROVIDER_SITE_OTHER): Payer: Medicare PPO | Admitting: Nurse Practitioner

## 2022-12-07 VITALS — BP 143/64 | HR 64 | Resp 16 | Wt 112.2 lb

## 2022-12-07 DIAGNOSIS — I6529 Occlusion and stenosis of unspecified carotid artery: Secondary | ICD-10-CM

## 2022-12-11 ENCOUNTER — Encounter (INDEPENDENT_AMBULATORY_CARE_PROVIDER_SITE_OTHER): Payer: Self-pay | Admitting: Nurse Practitioner

## 2022-12-11 NOTE — Progress Notes (Signed)
Subjective:    Patient ID: Kerry Jordan, female    DOB: 26-Jul-1945, 77 y.o.   MRN: [id number] Chief Complaint  Patient presents with   Follow-up    Ultrasound results    Kerry Jordan is a 77 year old female that presents today for evaluation of carotid stenosis after recent dentist appointment.  She had x-rays done which showed some carotid atherosclerosis.  The patient denies any TIA or amaurosis fugax-like symptoms.  She denies any recent stroke.  She does have a family history of atherosclerotic events.  The patient is also diabetic as well as a history of hypertension.  She has no claudication-like symptoms or rest pain like symptoms.  Today noninvasive studies show 1 to 39% stenosis bilaterally however there is a minimal increase in velocities from previous studies.  Bilateral vertebral arteries have antegrade flow with normal flow hemodynamics in the subclavian arteries.    Review of Systems  Neurological:  Negative for dizziness, facial asymmetry, speech difficulty and light-headedness.  Psychiatric/Behavioral:  The patient is nervous/anxious.   All other systems reviewed and are negative.      Objective:   Physical Exam Vitals reviewed.  HENT:     Head: Normocephalic.  Neck:     Vascular: No carotid bruit.  Cardiovascular:     Rate and Rhythm: Normal rate.     Pulses: Normal pulses.  Pulmonary:     Effort: Pulmonary effort is normal.  Skin:    General: Skin is warm and dry.  Neurological:     Mental Status: She is alert and oriented to person, place, and time.  Psychiatric:        Mood and Affect: Mood normal.        Behavior: Behavior normal.        Thought Content: Thought content normal.        Judgment: Judgment normal.     BP (!) 143/64 (BP Location: Left Arm)   Pulse 64   Resp 16   Wt 112 lb 3.2 oz (50.9 kg)   BMI 23.45 kg/m   Past Medical History:  Diagnosis Date   Anxiety    Diabetes mellitus without complication ([facility])     Hypercholesteremia    Hypertension     Social History   Socioeconomic History   Marital status: Single    Spouse name: Not on file   Number of children: Not on file   Years of education: Not on file   Highest education level: Not on file  Occupational History   Not on file  Tobacco Use   Smoking status: Never   Smokeless tobacco: Never  Vaping Use   Vaping status: Never Used  Substance and Sexual Activity   Alcohol use: Not Currently    Comment: occasional wine-a few glasses per year or one mixed drink per year   Drug use: No   Sexual activity: Not on file  Other Topics Concern   Not on file  Social History Narrative   Not on file   Social Determinants of Health   Financial Resource Strain: Not on file  Food Insecurity: Not on file  Transportation Needs: Not on file  Physical Activity: Not on file  Stress: Not on file  Social Connections: Not on file  Intimate Partner Violence: Not on file    Past Surgical History:  Procedure Laterality Date   BREAST BIOPSY Left 01/03/12   [place] bx/clip-neg   COLONOSCOPY WITH PROPOFOL N/A 03/27/2017   Procedure: COLONOSCOPY WITH PROPOFOL;  Surgeon: Christena Deem, MD;  Location: [facility] ENDOSCOPY;  Service: Endoscopy;  Laterality: N/A;   FOOT SURGERY      Family History  Problem Relation Age of Onset   Blindness Mother    Diabetes Mother    Heart failure Mother    Heart attack Father    Breast cancer Neg Hx     Allergies  Allergen Reactions   Sulfa Antibiotics Nausea And Vomiting       Latest Ref Rng & Units 11/20/2019    2:12 PM 05/17/2016    2:27 AM 02/08/2015    1:50 PM  CBC  WBC 4.0 - 10.5 K/uL 10.0  9.3  8.3   Hemoglobin 12.0 - 15.0 g/dL 32.9  51.8  84.1   Hematocrit 36.0 - 46.0 % 40.6  41.8  42.1   Platelets 150 - 400 K/uL 338  230  241       CMP     Component Value Date/Time   NA 132 (L) 11/20/2019 1412   K 3.1 (L) 11/20/2019 1412   CL 98 11/20/2019 1412   CO2 18 (L) 11/20/2019 1412   GLUCOSE 138  (H) 11/20/2019 1412   BUN 29 (H) 11/20/2019 1412   CREATININE 0.56 11/20/2019 1412   CALCIUM 9.0 11/20/2019 1412   GFRNONAA >60 11/20/2019 1412   GFRAA >60 05/17/2016 0227     No results found.     Assessment & Plan:   1. Carotid atherosclerosis, unspecified laterality Recommend:  Given the patient's asymptomatic subcritical stenosis no further invasive testing or surgery at this time.  Duplex ultrasound shows 1 to 39% bilaterally  Continue antiplatelet therapy as prescribed Continue management of CAD, HTN and Hyperlipidemia Healthy heart diet,  encouraged exercise at least 4 times per week Follow up in 12 months with duplex ultrasound and physical exam     Current Outpatient Medications on File Prior to Visit  Medication Sig Dispense Refill   aspirin EC 81 MG tablet Take 1 tablet (81 mg total) by mouth daily. Swallow whole.     atorvastatin (LIPITOR) 40 MG tablet Take 40 mg by mouth daily.     busPIRone (BUSPAR) 10 MG tablet Take 10 mg by mouth daily.     carvedilol (COREG) 6.25 MG tablet Take 6.25 mg by mouth 2 (two) times daily with a meal.     hydrochlorothiazide (HYDRODIURIL) 25 MG tablet Take 25 mg by mouth daily.     losartan (COZAAR) 50 MG tablet Take 50 mg by mouth daily.     methotrexate (RHEUMATREX) 2.5 MG tablet Take 17.5 mg by mouth once a week.     triamcinolone cream (KENALOG) 0.1 % Apply 1 application topically 2 (two) times daily.     valACYclovir (VALTREX) 500 MG tablet Take 500 mg by mouth as needed.     metFORMIN (GLUCOPHAGE) 500 MG tablet Take by mouth 2 (two) times daily with a meal. (Patient not taking: Reported on 12/07/2022)     No current facility-administered medications on file prior to visit.    There are no Patient Instructions on file for this visit. No follow-ups on file.   [name], NP

## 2022-12-14 ENCOUNTER — Encounter: Payer: Self-pay | Admitting: *Deleted

## 2022-12-19 ENCOUNTER — Encounter: Payer: Self-pay | Admitting: Physician Assistant

## 2022-12-19 ENCOUNTER — Ambulatory Visit: Payer: Medicare PPO | Attending: Physician Assistant | Admitting: Emergency Medicine

## 2022-12-19 VITALS — BP 126/72 | HR 53 | Ht <= 58 in | Wt 112.4 lb

## 2022-12-19 DIAGNOSIS — Z79899 Other long term (current) drug therapy: Secondary | ICD-10-CM

## 2022-12-19 DIAGNOSIS — I6523 Occlusion and stenosis of bilateral carotid arteries: Secondary | ICD-10-CM | POA: Diagnosis not present

## 2022-12-19 DIAGNOSIS — I10 Essential (primary) hypertension: Secondary | ICD-10-CM | POA: Diagnosis not present

## 2022-12-19 DIAGNOSIS — E119 Type 2 diabetes mellitus without complications: Secondary | ICD-10-CM | POA: Diagnosis not present

## 2022-12-19 DIAGNOSIS — E785 Hyperlipidemia, unspecified: Secondary | ICD-10-CM

## 2022-12-19 NOTE — Progress Notes (Signed)
Cardiology Office Note:    Date:  12/19/2022  ID:  Kerry Jordan, DOB 02/19/1945, MRN [id number] PCP: Melissa Montane, MD  [facility] Providers Cardiologist:  Debbe Odea, MD       Patient Profile:      Kerry Jordan Patient is a 77 year old female with visit pertinent history of anxiety, diabetes, hyperlipidemia, hypertension, carotid artery disease, Atherosclerotic calcification of the aortic arch.  Established with cardiology on February 2022 due to syncope. She presented to the ED 11/2019 because her brother fell from a ladder and broke his leg. While in the ED, she states being very stressed and anxious, felt dizzy and passed out.  ZIO monitor completed February 2022 with predominant underlying rhythm of sinus.  She had an average heart rate of 71 bpm (range 51 bpm - 139 bpm), 1 run of SVT lasting 4 beats, no significant arrhythmias noted.  Echocardiogram March 2022 showing LVEF 60-65%, no RWMA, grade 2 DD, RV SF normal, no valvular abnormalities.  She has history of carotid artery disease originally noted with coronary artery calcification on a dental x-ray.  Carotid duplex completed November 2022 showing no evidence of stenosis in right ICA, 1-39% stenosis in left ICA.  Repeat carotid duplex 12/2022 with no significant change noted compared to previous exam.       History of Present Illness:  Kerry Jordan is a 77 y.o. female who returns for her 1 year follow-up.  She has been doing well over the past year.  She denies any cardiovascular concerns or complaints.  No major cardiovascular incidents over the past year were noted.  She currently sees vascular surgery for yearly follow-up for carotid artery disease.  She notes that she has been on a keto diet and has been able to lower her A1c enough to come off of her metformin.  She has focused on decreasing processed foods.  She gets minimal exercise as of late due to the cold weather however she used to walk daily in her  neighborhood.  She would like to [place] a [place] to a gym near her.  She is without any complaints today and denies chest pain, shortness of breath, lower extremity edema, fatigue, palpitations, melena, hematuria, hemoptysis, diaphoresis, weakness, presyncope, syncope, orthopnea, and PND.       Review of Systems  Constitutional: Negative for weight gain and weight loss.  Cardiovascular:  Negative for chest pain, claudication, cyanosis, dyspnea on exertion, irregular heartbeat, leg swelling, near-syncope, orthopnea, palpitations, paroxysmal nocturnal dyspnea and syncope.  Respiratory:  Negative for cough, hemoptysis and shortness of breath.   Gastrointestinal:  Negative for abdominal pain, hematochezia and melena.  Genitourinary:  Negative for hematuria.     See HPI     Studies Reviewed:   EKG Interpretation Date/Time:  Monday December 19 2022 11:29:43 EST Ventricular Rate:  53 PR Interval:  136 QRS Duration:  72 QT Interval:  368 QTC Calculation: 345 R Axis:   1  Text Interpretation: Sinus bradycardia Nonspecific T wave abnormality Confirmed by Rise Paganini [phone]) on 12/19/2022 11:34:20 AM    Carotid duplex 12/05/2022 Right Carotid: The extracranial vessels were near-normal with only minimal  wall thickening or plaque.   Left Carotid: The extracranial vessels were near-normal with only minimal  wall thickening or plaque.  Echocardiogram 03/25/2020 1. Left ventricular ejection fraction, by estimation, is 60 to 65%. The  left ventricle has normal function. The left ventricle has no  regional  wall motion abnormalities. Left ventricular diastolic parameters are  consistent with Grade II diastolic  dysfunction (pseudonormalization).   2. Right ventricular systolic function is normal. The right ventricular  size is normal.   3. The mitral valve is normal in structure. No evidence of mitral valve  regurgitation.   4. The  aortic valve was not well visualized. Aortic valve regurgitation  is not visualized.   5. The inferior vena cava is normal in size with greater than 50%  respiratory variability, suggesting right atrial pressure of 3 mmHg.   Zio 03/02/2020 Patient had a min HR of 51 bpm, max HR of 139 bpm, and avg HR of 71 bpm. Predominant underlying rhythm was Sinus Rhythm. 1 run of Supraventricular Tachycardia occurred lasting 4 beats with a max rate of 139 bpm (avg 128 bpm). Isolated SVEs were rare  (<1.0%), SVE Couplets were rare (<1.0%), and no SVE Triplets were present. Isolated VEs were rare (<1.0%), and no VE Couplets or VE Triplets were present.  No significant arrhythmias noted to account for syncopal episode.  Benign cardiac monitor. Risk Assessment/Calculations:             Physical Exam:   VS:  BP 126/72 (BP Location: Left Arm, Patient Position: Sitting, Cuff Size: Normal)   Pulse (!) 53   Ht 4\' 9"  (1.448 m)   Wt 112 lb 6.4 oz (51 kg)   SpO2 97%   BMI 24.32 kg/m    Wt Readings from Last 3 Encounters:  12/19/22 112 lb 6.4 oz (51 kg)  12/07/22 112 lb 3.2 oz (50.9 kg)  04/29/21 111 lb (50.3 kg)    Constitutional:      Appearance: Normal and healthy appearance.  HENT:     Head: Normocephalic.  Neck:     Vascular: JVD normal.  Pulmonary:     Effort: Pulmonary effort is normal.     Breath sounds: Normal breath sounds.  Chest:     Chest wall: Not tender to palpatation.  Cardiovascular:     PMI at left midclavicular line. Normal rate. Regular rhythm. Normal S1. Normal S2.      Murmurs: There is no murmur.     No gallop.  No click. No rub.  Pulses:    Intact distal pulses.  Edema:    Peripheral edema absent.  Musculoskeletal: Normal range of motion.     Cervical back: Normal range of motion and neck supple. Skin:    General: Skin is warm and dry.  Neurological:     General: No focal deficit present.     Mental Status: Alert, oriented to person, place, and time and oriented to  person, place and time.  Psychiatric:        Attention and Perception: Attention and perception normal.        Mood and Affect: Mood normal.        Behavior: Behavior is cooperative.        Thought Content: Thought content normal.       Assessment and Plan:  Carotid artery disease -Carotid duplex 12/2022 showing right and left ICA near normal with only minimal thickening or plaque.  Follows with vascular surgery who recommends yearly follow-up ultrasounds.  Focus on lipid control and healthy lifestyle modifications.  Continue aspirin EC 81mg  and atorvastatin 40mg .   Hyperlipidemia, goal <70 / Atherosclerotic calcification of the aortic arch -LDL cholesterol 96 on 05/18/2021 and atherosclerotic calcification found on chest x-ray 2018.  Currently stable with no anginal symptoms,  no indication for ischemic evaluation.  Given the presence of atherosclerosis her LDL goal will be less than 70.  Plan for fasting lipid panel and LFTs today.  We spoke regarding her options and she would like to trial ezetimibe instead of increasing her atorvastatin due to fear of myalgias.  She has vastly improved her diet over the past year but I do encourage her for at least 150 minutes of moderate intensity aerobic exercise weekly.  Continue atorvastatin 40 mg, aspirin EC 81 mg.   Hypertension -Her blood pressure today is controlled at 126/72.  She notes has been under better control over the past year.  Continue heart healthy dieting and focus on decreasing salt in your diet.  Continue carvedilol 6.25mg  twice daily, hydrochlorothiazide 25mg  once daily, losartan 50mg  once daily.  Encouraged her to bring in a blood pressure log to her yearly visits.   T2DM -Hemoglobin A1c was 6.3 on 06/2022.  Currently followed by her PCP.  With the addition of the [facility] a keto diet she has been able to lose weight and lower her A1c.  She is no longer on metformin.                 Dispo:  Return in about 1 year (around  12/19/2023).  Signed, Denyce Robert, NP

## 2022-12-19 NOTE — Patient Instructions (Signed)
Medication Instructions:  Your Physician recommend you continue on your current medication as directed.    *If you need a refill on your cardiac medications before your next appointment, please call your pharmacy*   Lab Work: Your provider would like for you to have following labs drawn today Lipid panel and LFT.   If you have labs (blood work) drawn today and your tests are completely normal, you will receive your results only by: MyChart Message (if you have MyChart) OR A paper copy in the mail If you have any lab test that is abnormal or we need to change your treatment, we will call you to review the results.   Follow-Up: At [facility], you and your health needs are our priority.  As part of our continuing mission to provide you with exceptional heart care, we have created designated Provider Care Teams.  These Care Teams include your primary Cardiologist (physician) and Advanced Practice Providers (APPs -  Physician Assistants and Nurse Practitioners) who all work together to provide you with the care you need, when you need it.  We recommend signing up for the patient portal called "MyChart".  Sign up information is provided on this After Visit Summary.  MyChart is used to connect with patients for Virtual Visits (Telemedicine).  Patients are able to view lab/test results, encounter notes, upcoming appointments, etc.  Non-urgent messages can be sent to your provider as well.   To learn more about what you can do with MyChart, go to [email or web address].    Your next appointment:   1 year(s)  Provider:   You may see Debbe Odea, MD or one of the following Advanced Practice Providers on your designated Care Team:   Eula Listen, [place]

## 2022-12-20 LAB — LIPID PANEL
Chol/HDL Ratio: 3.4 {ratio} (ref 0.0–4.4)
Cholesterol, Total: 182 mg/dL (ref 100–199)
HDL: 54 mg/dL (ref >39)
LDL Chol Calc (NIH): 116 mg/dL — ABNORMAL HIGH (ref 0–99)
Triglycerides: 61 mg/dL (ref 0–149)
VLDL Cholesterol Cal: 12 mg/dL (ref 5–40)

## 2022-12-20 LAB — HEPATIC FUNCTION PANEL
ALT: 13 [IU]/L (ref 0–32)
AST: 12 [IU]/L (ref 0–40)
Albumin: 4.1 g/dL (ref 3.8–4.8)
Alkaline Phosphatase: 69 [IU]/L (ref 44–121)
Bilirubin Total: 0.4 mg/dL (ref 0.0–1.2)
Bilirubin, Direct: 0.13 mg/dL (ref 0.00–0.40)
Total Protein: 6.7 g/dL (ref 6.0–8.5)

## 2022-12-27 ENCOUNTER — Other Ambulatory Visit: Payer: Self-pay | Admitting: Emergency Medicine

## 2022-12-27 DIAGNOSIS — E785 Hyperlipidemia, unspecified: Secondary | ICD-10-CM

## 2022-12-27 DIAGNOSIS — Z79899 Other long term (current) drug therapy: Secondary | ICD-10-CM

## 2022-12-27 MED ORDER — EZETIMIBE 10 MG PO TABS: 10.0000 mg | ORAL_TABLET | Freq: Every day | ORAL | 3 refills | Status: AC

## 2023-05-12 ENCOUNTER — Other Ambulatory Visit: Payer: Self-pay | Admitting: Family Medicine

## 2023-05-12 DIAGNOSIS — Z1231 Encounter for screening mammogram for malignant neoplasm of breast: Secondary | ICD-10-CM

## 2023-05-23 ENCOUNTER — Encounter (INDEPENDENT_AMBULATORY_CARE_PROVIDER_SITE_OTHER): Payer: Self-pay

## 2023-05-30 ENCOUNTER — Ambulatory Visit
Admission: RE | Admit: 2023-05-30 | Discharge: 2023-05-30 | Disposition: A | Discharge status: Home / Self Care | Source: Ambulatory Visit | Attending: Family Medicine | Admitting: Family Medicine

## 2023-05-30 DIAGNOSIS — Z1231 Encounter for screening mammogram for malignant neoplasm of breast: Secondary | ICD-10-CM | POA: Diagnosis present

## 2023-06-06 ENCOUNTER — Other Ambulatory Visit: Payer: Self-pay | Admitting: Student

## 2023-06-06 DIAGNOSIS — R928 Other abnormal and inconclusive findings on diagnostic imaging of breast: Secondary | ICD-10-CM

## 2023-06-08 ENCOUNTER — Ambulatory Visit
Admission: RE | Admit: 2023-06-08 | Discharge: 2023-06-08 | Discharge status: Home / Self Care | Source: Ambulatory Visit | Attending: Student | Admitting: Student

## 2023-06-08 ENCOUNTER — Ambulatory Visit
Admission: RE | Admit: 2023-06-08 | Discharge: 2023-06-08 | Disposition: A | Discharge status: Home / Self Care | Source: Ambulatory Visit | Attending: Student | Admitting: Student

## 2023-06-08 DIAGNOSIS — R928 Other abnormal and inconclusive findings on diagnostic imaging of breast: Secondary | ICD-10-CM | POA: Insufficient documentation

## 2023-06-12 ENCOUNTER — Encounter

## 2023-06-12 ENCOUNTER — Other Ambulatory Visit

## 2023-11-07 ENCOUNTER — Encounter: Payer: Self-pay | Admitting: Ophthalmology

## 2023-11-13 ENCOUNTER — Ambulatory Visit: Payer: Self-pay | Admitting: Anesthesiology

## 2023-11-13 ENCOUNTER — Ambulatory Visit
Admission: RE | Admit: 2023-11-13 | Discharge: 2023-11-13 | Disposition: A | Discharge status: Home / Self Care | Attending: Ophthalmology | Admitting: Ophthalmology

## 2023-11-13 ENCOUNTER — Other Ambulatory Visit: Payer: Self-pay

## 2023-11-13 ENCOUNTER — Encounter: Payer: Self-pay | Admitting: Ophthalmology

## 2023-11-13 ENCOUNTER — Encounter
Admission: RE | Disposition: A | Discharge status: Home / Self Care | Payer: Self-pay | Source: Home / Self Care | Attending: Ophthalmology

## 2023-11-13 DIAGNOSIS — M810 Age-related osteoporosis without current pathological fracture: Secondary | ICD-10-CM | POA: Diagnosis not present

## 2023-11-13 DIAGNOSIS — H25012 Cortical age-related cataract, left eye: Secondary | ICD-10-CM | POA: Diagnosis present

## 2023-11-13 DIAGNOSIS — E1136 Type 2 diabetes mellitus with diabetic cataract: Secondary | ICD-10-CM | POA: Diagnosis not present

## 2023-11-13 DIAGNOSIS — I10 Essential (primary) hypertension: Secondary | ICD-10-CM | POA: Diagnosis not present

## 2023-11-13 DIAGNOSIS — F419 Anxiety disorder, unspecified: Secondary | ICD-10-CM | POA: Insufficient documentation

## 2023-11-13 DIAGNOSIS — H2512 Age-related nuclear cataract, left eye: Secondary | ICD-10-CM | POA: Insufficient documentation

## 2023-11-13 HISTORY — DX: Syncope and collapse: R55

## 2023-11-13 HISTORY — DX: Age-related osteoporosis without current pathological fracture: M81.0

## 2023-11-13 HISTORY — PX: CATARACT EXTRACTION W/PHACO: SHX586

## 2023-11-13 HISTORY — DX: Occlusion and stenosis of bilateral carotid arteries: I65.23

## 2023-11-13 HISTORY — DX: Rheumatoid arthritis with rheumatoid factor of multiple sites without organ or systems involvement: M05.79

## 2023-11-13 HISTORY — DX: Unspecified osteoarthritis, unspecified site: M19.90

## 2023-11-13 HISTORY — DX: Other specified dermatitis: L30.8

## 2023-11-13 HISTORY — DX: Other ill-defined heart diseases: I51.89

## 2023-11-13 HISTORY — DX: Psoriasis, unspecified: L40.9

## 2023-11-13 LAB — GLUCOSE, CAPILLARY: Glucose-Capillary: 111 mg/dL — ABNORMAL HIGH (ref 70–99)

## 2023-11-13 SURGERY — PHACOEMULSIFICATION, CATARACT, WITH IOL INSERTION
Anesthesia: Monitor Anesthesia Care | Site: Eye | Laterality: Left

## 2023-11-13 MED ORDER — SIGHTPATH DOSE#1 BSS IO SOLN
INTRAOCULAR | Status: DC | PRN
Start: 2023-11-13 — End: 2023-11-13
  Administered 2023-11-13: 15 mL via INTRAOCULAR

## 2023-11-13 MED ORDER — FENTANYL CITRATE (PF) 100 MCG/2ML IJ SOLN
INTRAMUSCULAR | Status: DC | PRN
  Administered 2023-11-13: 50 ug via INTRAVENOUS

## 2023-11-13 MED ORDER — CYCLOPENTOLATE HCL 2 % OP SOLN
OPHTHALMIC | Status: AC
  Filled 2023-11-13: qty 2

## 2023-11-13 MED ORDER — TETRACAINE HCL 0.5 % OP SOLN
OPHTHALMIC | Status: AC
  Filled 2023-11-13: qty 4

## 2023-11-13 MED ORDER — SIGHTPATH DOSE#1 NA HYALUR & NA CHOND-NA HYALUR IO KIT
PACK | INTRAOCULAR | Status: DC | PRN
  Administered 2023-11-13: 1 via OPHTHALMIC

## 2023-11-13 MED ORDER — PHENYLEPHRINE HCL 10 % OP SOLN
OPHTHALMIC | Status: AC
  Filled 2023-11-13: qty 5

## 2023-11-13 MED ORDER — CYCLOPENTOLATE HCL 2 % OP SOLN
1.0000 [drp] | OPHTHALMIC | Status: DC | PRN
  Administered 2023-11-13 (×3): 1 [drp] via OPHTHALMIC

## 2023-11-13 MED ORDER — FENTANYL CITRATE (PF) 100 MCG/2ML IJ SOLN
INTRAMUSCULAR | Status: AC
  Filled 2023-11-13: qty 2

## 2023-11-13 MED ORDER — SIGHTPATH DOSE#1 BSS IO SOLN
INTRAOCULAR | Status: DC | PRN
  Administered 2023-11-13: 92 mL via OPHTHALMIC

## 2023-11-13 MED ORDER — MIDAZOLAM HCL 2 MG/2ML IJ SOLN
INTRAMUSCULAR | Status: AC
Start: 2023-11-13 — End: 2023-11-13
  Filled 2023-11-13: qty 2

## 2023-11-13 MED ORDER — TETRACAINE HCL 0.5 % OP SOLN
1.0000 [drp] | OPHTHALMIC | Status: DC | PRN
  Administered 2023-11-13 (×3): 1 [drp] via OPHTHALMIC

## 2023-11-13 MED ORDER — MOXIFLOXACIN HCL 0.5 % OP SOLN
OPHTHALMIC | Status: DC | PRN
  Administered 2023-11-13: .2 mL via OPHTHALMIC

## 2023-11-13 MED ORDER — MIDAZOLAM HCL (PF) 2 MG/2ML IJ SOLN
INTRAMUSCULAR | Status: DC | PRN
  Administered 2023-11-13: 1 mg via INTRAVENOUS

## 2023-11-13 MED ORDER — LIDOCAINE HCL (PF) 2 % IJ SOLN
INTRAMUSCULAR | Status: DC | PRN
  Administered 2023-11-13: 4 mL via INTRAOCULAR

## 2023-11-13 MED ORDER — PHENYLEPHRINE HCL 10 % OP SOLN
1.0000 [drp] | OPHTHALMIC | Status: DC | PRN
  Administered 2023-11-13 (×3): 1 [drp] via OPHTHALMIC

## 2023-11-13 MED ORDER — LACTATED RINGERS IV SOLN: INTRAVENOUS | Status: DC

## 2023-11-13 SURGICAL SUPPLY — 9 items
DISSECTOR HYDRO NUCLEUS 50X22 (MISCELLANEOUS) ×1 IMPLANT
FEE CATARACT SUITE SIGHTPATH (MISCELLANEOUS) ×1 IMPLANT
GLOVE PI ULTRA LF STRL 7.5 (GLOVE) ×1 IMPLANT
GLOVE SURG SYN 6.5 PF PI BL (GLOVE) ×1 IMPLANT
GLOVE SURG SYN 8.5 PF PI BL (GLOVE) ×1 IMPLANT
LENS IOL CLRN PANO TORIC 21.5 IMPLANT
NDL FILTER BLUNT 18X1 1/2 (NEEDLE) ×1 IMPLANT
NEEDLE FILTER BLUNT 18X1 1/2 (NEEDLE) ×1 IMPLANT
SYR 3ML LL SCALE MARK (SYRINGE) ×1 IMPLANT

## 2023-11-13 NOTE — Anesthesia Preprocedure Evaluation (Addendum)
 Anesthesia Evaluation  Patient identified by MRN, date of birth, ID band Patient awake    Reviewed: Allergy & Precautions, H&P , NPO status , Patient's Chart, lab work & pertinent test results  Airway Mallampati: III  TM Distance: >3 FB Neck ROM: Full    Dental no notable dental hx.  Some lower dental work, wire :   Pulmonary neg pulmonary ROS   Pulmonary exam normal breath sounds clear to auscultation       Cardiovascular hypertension, Normal cardiovascular exam Rhythm:Regular Rate:Normal  03-25-20 echo 1. Left ventricular ejection fraction, by estimation, is 60 to 65%. The  left ventricle has normal function. The left ventricle has no regional  wall motion abnormalities. Left ventricular diastolic parameters are  consistent with Grade II diastolic  dysfunction (pseudonormalization).   2. Right ventricular systolic function is normal. The right ventricular  size is normal.   3. The mitral valve is normal in structure. No evidence of mitral valve  regurgitation.   4. The aortic valve was not well visualized. Aortic valve regurgitation  is not visualized.   5. The inferior vena cava is normal in size with greater than 50%  respiratory variability, suggesting right atrial pressure of 3 mmHg.      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 Bilateral carotid artery stenosis Age related osteoporosis      Reproductive/Obstetrics negative OB ROS                              Anesthesia Physical Anesthesia Plan  ASA: 2  Anesthesia Plan: MAC   Post-op Pain Management:    Induction: Intravenous  PONV Risk Score and Plan:   Airway Management Planned: Natural Airway and Nasal Cannula  Additional Equipment:   Intra-op Plan:   Post-operative Plan:   Informed Consent: I have reviewed the patients History and Physical, chart, labs and discussed the procedure including the risks, benefits and alternatives for the proposed anesthesia with the patient or authorized representative who has indicated his/her understanding and acceptance.     Dental Advisory Given  Plan Discussed with: Anesthesiologist, CRNA and Surgeon  Anesthesia Plan Comments: (Patient consented for risks of anesthesia including but not limited to:  - adverse reactions to medications - damage to eyes, teeth, lips or other oral mucosa - nerve damage due to positioning  - sore throat or hoarseness - Damage to heart, brain, nerves, lungs, other parts of body or loss of life  Patient voiced understanding and assent.)         Anesthesia Quick Evaluation

## 2023-11-13 NOTE — Op Note (Signed)
 OPERATIVE NOTE  Kerry Jordan [id number] 11/13/2023   PREOPERATIVE DIAGNOSIS:  Nuclear sclerotic cataract left eye.  H25.12   POSTOPERATIVE DIAGNOSIS:    Nuclear sclerotic cataract left eye.     PROCEDURE:  Phacoemusification with posterior chamber intraocular lens placement of the left eye   LENS:   Implant Name Type Inv. Item Serial No. Manufacturer Lot No. LRB No. Used Action  LENS IOL CLRN PANO TORIC 21.5 - [id number]  LENS IOL CLRN PANO TORIC 21.5 [id number] [employer]  Left 1 Implanted      Procedure(s): PHACOEMULSIFICATION, CATARACT, WITH IOL INSERTION 6.56 00:44.8 (Left)  SURGEON:  Adine Novak, MD, MPH   ANESTHESIA:  Topical with tetracaine drops augmented with 1% preservative-free intracameral lidocaine .  ESTIMATED BLOOD LOSS: <1 mL   COMPLICATIONS:  None.   DESCRIPTION OF PROCEDURE:  The patient was identified in the holding room and transported to the operating room and placed in the supine position under the operating microscope.  The left eye was identified as the operative eye and it was prepped and draped in the usual sterile ophthalmic fashion.  The verion system was registered without difficulty.   A 1.0 millimeter clear-corneal paracentesis was made at the 5:00 position. 0.5 ml of preservative-free 1% lidocaine  with epinephrine was injected into the anterior chamber.  The anterior chamber was filled with viscoelastic.  A 2.4 millimeter keratome was used to make a near-clear corneal incision at the 2:00 position.  A curvilinear capsulorrhexis was made with a cystotome and capsulorrhexis forceps.  Balanced salt solution was used to hydrodissect and hydrodelineate the nucleus.   Phacoemulsification was then used in stop and chop fashion to remove the lens nucleus and epinucleus.  The remaining cortex was then removed using the irrigation and aspiration handpiece. Viscoelastic was then placed into the capsular bag to distend it for lens placement.  A lens was  then injected into the capsular bag.  The remaining viscoelastic was aspirated.  The lens was rotated with guidance from the verion system.   Wounds were hydrated with balanced salt solution.  The anterior chamber was inflated to a physiologic pressure with balanced salt solution.  Intracameral vigamox 0.1 mL undiltued was injected into the eye and a drop placed onto the ocular surface.  No wound leaks were noted.  The patient was taken to the recovery room in stable condition without complications of anesthesia or surgery  Adine Novak 11/13/2023, 10:49 AM

## 2023-11-13 NOTE — H&P (Signed)
 [facility]   Primary Care Physician:  Merle Mitzie LABOR, MD Ophthalmologist: Dr. Adine Novak  Pre-Procedure History & Physical: HPI:  Kerry Jordan is a 78 y.o. female here for cataract surgery.   Past Medical History:  Diagnosis Date   Age related osteoporosis    Anxiety    sees counselor for this   Arthritis    rheumatoid in the hands   Bilateral carotid artery stenosis    Diabetes mellitus without complication (HCC)    type 2 diet controlled   Grade II diastolic dysfunction    Hypercholesteremia    Hypertension    Psoriasis    Rheumatoid arthritis involving multiple sites with positive rheumatoid factor (HCC)    Spongiotic dermatitis    Vaso vagal episode    goes totally unresponsive when she has them    Past Surgical History:  Procedure Laterality Date   BREAST BIOPSY Left 01/03/12   us  bx/clip-neg   COLONOSCOPY WITH PROPOFOL  N/A 03/27/2017   Procedure: COLONOSCOPY WITH PROPOFOL ;  Surgeon: Gaylyn Gladis PENNER, MD;  Location: [facility] ENDOSCOPY;  Service: Endoscopy;  Laterality: N/A;   FOOT SURGERY      Prior to Admission medications   Medication Sig Start Date End Date Taking? Authorizing Provider  atorvastatin (LIPITOR) 80 MG tablet Take 80 mg by mouth at bedtime.   Yes [provider]  busPIRone (BUSPAR) 10 MG tablet Take 10 mg by mouth daily. Patient taking differently: Take 10 mg by mouth daily. Takes 1/2 tablet at breakfast and 1 tablet at bedtime   Yes [provider]  carvedilol (COREG) 6.25 MG tablet Take 6.25 mg by mouth 2 (two) times daily with a meal.   Yes [provider]  hydrochlorothiazide (HYDRODIURIL) 25 MG tablet Take 25 mg by mouth daily.   Yes [provider]  losartan (COZAAR) 50 MG tablet Take 50 mg by mouth daily. Patient taking differently: Take 100 mg by mouth daily.   Yes [provider]  methotrexate (RHEUMATREX) 2.5 MG tablet Take 17.5 mg by mouth once a week. Patient taking  differently: Take 2.5 mg by mouth once a week. Takes 8 tablets weekly 10/06/20  Yes [provider]  ezetimibe  (ZETIA ) 10 MG tablet Take 1 tablet (10 mg total) by mouth daily. 12/27/22 03/27/23  Rana Lum CROME, NP    Allergies as of 10/12/2023 - Review Complete 12/19/2022  Allergen Reaction Noted   Sulfa antibiotics Nausea And Vomiting 02/08/2015    Family History  Problem Relation Age of Onset   Blindness Mother    Diabetes Mother    Heart failure Mother    Heart attack Father    Breast cancer Neg Hx     Social History   Socioeconomic History   Marital status: Single    Spouse name: Not on file   Number of children: Not on file   Years of education: Not on file   Highest education level: Not on file  Occupational History   Not on file  Tobacco Use   Smoking status: Never   Smokeless tobacco: Never  Vaping Use   Vaping status: Never Used  Substance and Sexual Activity   Alcohol use: Yes    Comment: occasional wine-a few glasses per year or one mixed drink per year   Drug use: Never   Sexual activity: Not on file  Other Topics Concern   Not on file  Social History Narrative   Not on file   Social Drivers of Health  Financial Resource Strain: Low Risk  (08/21/2023)   Received from [facility]   Overall Financial Resource Strain (CARDIA)    Difficulty of Paying Living Expenses: Not hard at all  Food Insecurity: No Food Insecurity (08/21/2023)   Received [facility]   Hunger Vital Sign    Within the past 12 months, you worried that your food would run out before you got the money to buy more.: Never true    Within the past 12 months, the food you bought just didn't last and you didn't have money to get more.: Never true  Transportation Needs: No Transportation Needs (08/21/2023)   Received from [facility] - Transportation    In the past 12 months, has lack of transportation kept you  from medical appointments or from getting medications?: No    Lack of Transportation (Non-Medical): No  Physical Activity: Not on file  Stress: Not on file  Social Connections: Not on file  Intimate Partner Violence: Not on file    Review of Systems: See HPI, otherwise negative ROS  Physical Exam: BP (!) 172/73   Temp (!) 97.2 F (36.2 C) (Temporal)   Resp 20   Ht 4' 9.99 (1.473 m)   Wt 48.4 kg   SpO2 97%   BMI 22.29 kg/m  General:   Alert, cooperative. Head:  Normocephalic and atraumatic. Respiratory:  Normal work of breathing. Cardiovascular:  NAD  Impression/Plan: Kerry Jordan is here for cataract surgery.  Risks, benefits, limitations, and alternatives regarding cataract surgery have been reviewed with the patient.  Questions have been answered.  All parties agreeable.   Adine Novak, MD  11/13/2023, 10:30 AM

## 2023-11-13 NOTE — Transfer of Care (Signed)
 Immediate Anesthesia Transfer of Care Note  Patient: Kerry Jordan  Procedure(s) Performed: PHACOEMULSIFICATION, CATARACT, WITH IOL INSERTION 6.56 00:44.8 (Left: Eye)  Patient Location: PACU  Anesthesia Type: MAC  Level of Consciousness: awake, alert  and patient cooperative  Airway and Oxygen Therapy: Patient Spontanous Breathing and Patient connected to supplemental oxygen  Post-op Assessment: Post-op Vital signs reviewed, Patient's Cardiovascular Status Stable, Respiratory Function Stable, Patent Airway and No signs of Nausea or vomiting  Post-op Vital Signs: Reviewed and stable  Complications: No notable events documented.

## 2023-11-13 NOTE — Discharge Instructions (Signed)

## 2023-11-13 NOTE — Anesthesia Postprocedure Evaluation (Signed)
 Anesthesia Post Note  Patient: Kerry Jordan  Procedure(s) Performed: PHACOEMULSIFICATION, CATARACT, WITH IOL INSERTION 6.56 00:44.8 (Left: Eye)  Patient location during evaluation: PACU Anesthesia Type: MAC Level of consciousness: awake and alert Pain management: pain level controlled Vital Signs Assessment: post-procedure vital signs reviewed and stable Respiratory status: spontaneous breathing, nonlabored ventilation, respiratory function stable and patient connected to nasal cannula oxygen Cardiovascular status: stable and blood pressure returned to baseline Postop Assessment: no apparent nausea or vomiting Anesthetic complications: no   No notable events documented.   Last Vitals:  Vitals:   11/13/23 1053 11/13/23 1058  BP: (!) 168/71 (!) 150/68  Pulse: 69 66  Resp: (!) 25 20  Temp: (!) 36.2 C (!) 36.2 C  SpO2: 98% 98%    Last Pain:  Vitals:   11/13/23 1058  TempSrc:   PainSc: 0-No pain                 Crescentia Boutwell C Signora Zucco

## 2023-11-14 ENCOUNTER — Encounter: Payer: Self-pay | Admitting: Ophthalmology

## 2023-11-21 NOTE — Anesthesia Preprocedure Evaluation (Addendum)
 Anesthesia Evaluation  Patient identified by MRN, date of birth, ID band Patient awake    Reviewed: Allergy & Precautions, H&P , NPO status , Patient's Chart, lab work & pertinent test results  Airway Mallampati: III  TM Distance: >3 FB Neck ROM: Full    Dental no notable dental hx.   Some lower dental work, wire  :   Pulmonary neg pulmonary ROS   Pulmonary exam normal breath sounds clear to auscultation       Cardiovascular hypertension, negative cardio ROS Normal cardiovascular exam Rhythm:Regular Rate:Normal  03-25-20 echo 1. Left ventricular ejection fraction, by estimation, is 60 to 65%. The  left ventricle has normal function. The left ventricle has no regional  wall motion abnormalities. Left ventricular diastolic parameters are  consistent with Grade II diastolic  dysfunction (pseudonormalization).   2. Right ventricular systolic function is normal. The right ventricular  size is normal.   3. The mitral valve is normal in structure. No evidence of mitral valve  regurgitation.   4. The aortic valve was not well visualized. Aortic valve regurgitation  is not visualized.   5. The inferior vena cava is normal in size with greater than 50%  respiratory variability, suggesting right atrial pressure of 3 mmHg.       Neuro/Psych  PSYCHIATRIC DISORDERS Anxiety     negative neurological ROS  negative psych ROS   GI/Hepatic negative GI ROS, Neg liver ROS,,,  Endo/Other  negative endocrine ROSdiabetes    Renal/GU negative Renal ROS  negative genitourinary   Musculoskeletal negative musculoskeletal ROS (+) Arthritis ,    Abdominal   Peds negative pediatric ROS (+)  Hematology negative hematology ROS (+)   Anesthesia Other Findings Previous cataract surgery 11-13-23 Dr. Ola   Diabetes mellitus without complication  Hypertension Anxiety             Hypercholesteremia Arthritis             Vaso vagal  episode Psoriasis             Grade II diastolic dysfunction Spongiotic dermatits Rheumatoid arthritis involving multiple sites with positive rheumatoid factor Bilateral carotid artery stenosis Age related osteoporosis       Reproductive/Obstetrics negative OB ROS                              Anesthesia Physical Anesthesia Plan  ASA: 3  Anesthesia Plan: MAC   Post-op Pain Management:    Induction: Intravenous  PONV Risk Score and Plan:   Airway Management Planned: Natural Airway and Nasal Cannula  Additional Equipment:   Intra-op Plan:   Post-operative Plan:   Informed Consent: I have reviewed the patients History and Physical, chart, labs and discussed the procedure including the risks, benefits and alternatives for the proposed anesthesia with the patient or authorized representative who has indicated his/her understanding and acceptance.     Dental Advisory Given  Plan Discussed with: Anesthesiologist, CRNA and Surgeon  Anesthesia Plan Comments: (Patient consented for risks of anesthesia including but not limited to:  - adverse reactions to medications - damage to eyes, teeth, lips or other oral mucosa - nerve damage due to positioning  - sore throat or hoarseness - Damage to heart, brain, nerves, lungs, other parts of body or loss of life  Patient voiced understanding and assent.)         Anesthesia Quick Evaluation

## 2023-11-23 NOTE — Discharge Instructions (Signed)

## 2023-11-27 ENCOUNTER — Other Ambulatory Visit: Payer: Self-pay

## 2023-11-27 ENCOUNTER — Ambulatory Visit: Payer: Self-pay | Admitting: Anesthesiology

## 2023-11-27 ENCOUNTER — Encounter
Admission: RE | Disposition: A | Discharge status: Home / Self Care | Payer: Self-pay | Source: Home / Self Care | Attending: Ophthalmology

## 2023-11-27 ENCOUNTER — Ambulatory Visit
Admission: RE | Admit: 2023-11-27 | Discharge: 2023-11-27 | Disposition: A | Discharge status: Home / Self Care | Attending: Ophthalmology | Admitting: Ophthalmology

## 2023-11-27 ENCOUNTER — Encounter: Payer: Self-pay | Admitting: Ophthalmology

## 2023-11-27 DIAGNOSIS — I10 Essential (primary) hypertension: Secondary | ICD-10-CM | POA: Insufficient documentation

## 2023-11-27 DIAGNOSIS — M810 Age-related osteoporosis without current pathological fracture: Secondary | ICD-10-CM | POA: Insufficient documentation

## 2023-11-27 DIAGNOSIS — E1136 Type 2 diabetes mellitus with diabetic cataract: Secondary | ICD-10-CM | POA: Diagnosis not present

## 2023-11-27 DIAGNOSIS — H2511 Age-related nuclear cataract, right eye: Secondary | ICD-10-CM | POA: Diagnosis present

## 2023-11-27 DIAGNOSIS — H25011 Cortical age-related cataract, right eye: Secondary | ICD-10-CM | POA: Diagnosis present

## 2023-11-27 DIAGNOSIS — F419 Anxiety disorder, unspecified: Secondary | ICD-10-CM | POA: Insufficient documentation

## 2023-11-27 HISTORY — PX: CATARACT EXTRACTION W/PHACO: SHX586

## 2023-11-27 LAB — GLUCOSE, CAPILLARY: Glucose-Capillary: 125 mg/dL — ABNORMAL HIGH (ref 70–99)

## 2023-11-27 SURGERY — PHACOEMULSIFICATION, CATARACT, WITH IOL INSERTION
Anesthesia: Monitor Anesthesia Care | Site: Eye | Laterality: Right

## 2023-11-27 MED ORDER — PHENYLEPHRINE HCL 10 % OP SOLN
1.0000 [drp] | OPHTHALMIC | Status: AC
  Administered 2023-11-27 (×3): 1 [drp] via OPHTHALMIC

## 2023-11-27 MED ORDER — PHENYLEPHRINE HCL 10 % OP SOLN
OPHTHALMIC | Status: AC
  Filled 2023-11-27: qty 5

## 2023-11-27 MED ORDER — LIDOCAINE HCL (PF) 2 % IJ SOLN
INTRAMUSCULAR | Status: DC | PRN
  Administered 2023-11-27: 4 mL via INTRAOCULAR

## 2023-11-27 MED ORDER — FENTANYL CITRATE (PF) 100 MCG/2ML IJ SOLN
INTRAMUSCULAR | Status: DC | PRN
  Administered 2023-11-27: 50 ug via INTRAVENOUS

## 2023-11-27 MED ORDER — FENTANYL CITRATE (PF) 100 MCG/2ML IJ SOLN
INTRAMUSCULAR | Status: AC
  Filled 2023-11-27: qty 2

## 2023-11-27 MED ORDER — LACTATED RINGERS IV SOLN: INTRAVENOUS | Status: DC

## 2023-11-27 MED ORDER — MIDAZOLAM HCL (PF) 2 MG/2ML IJ SOLN
INTRAMUSCULAR | Status: DC | PRN
  Administered 2023-11-27: 1 mg via INTRAVENOUS

## 2023-11-27 MED ORDER — CYCLOPENTOLATE HCL 2 % OP SOLN
1.0000 [drp] | OPHTHALMIC | Status: AC
  Administered 2023-11-27 (×3): 1 [drp] via OPHTHALMIC

## 2023-11-27 MED ORDER — MIDAZOLAM HCL 2 MG/2ML IJ SOLN
INTRAMUSCULAR | Status: AC
Start: 2023-11-27 — End: 2023-11-27
  Filled 2023-11-27: qty 2

## 2023-11-27 MED ORDER — EPINEPHRINE PF 1 MG/ML IJ SOLN
INTRAMUSCULAR | Status: DC | PRN
  Administered 2023-11-27: 94 mL via OPHTHALMIC

## 2023-11-27 MED ORDER — TETRACAINE HCL 0.5 % OP SOLN
1.0000 [drp] | OPHTHALMIC | Status: DC | PRN
  Administered 2023-11-27 (×3): 1 [drp] via OPHTHALMIC

## 2023-11-27 MED ORDER — SIGHTPATH DOSE#1 NA HYALUR & NA CHOND-NA HYALUR IO KIT
PACK | INTRAOCULAR | Status: DC | PRN
Start: 2023-11-27 — End: 2023-11-27
  Administered 2023-11-27: 1 via OPHTHALMIC

## 2023-11-27 MED ORDER — MOXIFLOXACIN HCL 0.5 % OP SOLN
OPHTHALMIC | Status: DC | PRN
Start: 2023-11-27 — End: 2023-11-27
  Administered 2023-11-27: .2 mL via OPHTHALMIC

## 2023-11-27 MED ORDER — SIGHTPATH DOSE#1 BSS IO SOLN
INTRAOCULAR | Status: DC | PRN
  Administered 2023-11-27: 15 mL via INTRAOCULAR

## 2023-11-27 MED ORDER — TETRACAINE HCL 0.5 % OP SOLN
OPHTHALMIC | Status: AC
Start: 2023-11-27 — End: 2023-11-27
  Filled 2023-11-27: qty 4

## 2023-11-27 MED ORDER — CYCLOPENTOLATE HCL 2 % OP SOLN
OPHTHALMIC | Status: AC
  Filled 2023-11-27: qty 2

## 2023-11-27 SURGICAL SUPPLY — 9 items
DISSECTOR HYDRO NUCLEUS 50X22 (MISCELLANEOUS) ×1 IMPLANT
FEE CATARACT SUITE SIGHTPATH (MISCELLANEOUS) ×1 IMPLANT
GLOVE PI ULTRA LF STRL 7.5 (GLOVE) ×1 IMPLANT
GLOVE SURG SYN 6.5 PF PI BL (GLOVE) ×1 IMPLANT
GLOVE SURG SYN 8.5 PF PI BL (GLOVE) ×1 IMPLANT
LENS IOL PANO PRO TORC 21.5 IMPLANT
NDL FILTER BLUNT 18X1 1/2 (NEEDLE) ×1 IMPLANT
NEEDLE FILTER BLUNT 18X1 1/2 (NEEDLE) ×1 IMPLANT
SYR 3ML LL SCALE MARK (SYRINGE) ×1 IMPLANT

## 2023-11-27 NOTE — H&P (Signed)
 [facility]   Primary Care Physician:  Merle Mitzie LABOR, MD Ophthalmologist: Dr. Adine Novak  Pre-Procedure History & Physical: HPI:  Kerry Jordan is a 78 y.o. female here for cataract surgery.   Past Medical History:  Diagnosis Date   Age related osteoporosis    Anxiety    sees counselor for this   Arthritis    rheumatoid in the hands   Bilateral carotid artery stenosis    Diabetes mellitus without complication (HCC)    type 2 diet controlled   Grade II diastolic dysfunction    Hypercholesteremia    Hypertension    Psoriasis    Rheumatoid arthritis involving multiple sites with positive rheumatoid factor (HCC)    Spongiotic dermatitis    Vaso vagal episode    goes totally unresponsive when she has them    Past Surgical History:  Procedure Laterality Date   BREAST BIOPSY Left 01/03/12   us  bx/clip-neg   CATARACT EXTRACTION W/PHACO Left 11/13/2023   Procedure: PHACOEMULSIFICATION, CATARACT, WITH IOL INSERTION 6.56 00:44.8;  Surgeon: Novak Adine Anes, MD;  Location[facility];  Service: Ophthalmology;  Laterality: Left;   COLONOSCOPY WITH PROPOFOL  N/A 03/27/2017   Procedure: COLONOSCOPY WITH PROPOFOL ;  Surgeon: Gaylyn Gladis PENNER, MD;  Location: [facility] ENDOSCOPY;  Service: Endoscopy;  Laterality: N/A;   FOOT SURGERY      Prior to Admission medications   Medication Sig Start Date End Date Taking? Authorizing Provider  atorvastatin (LIPITOR) 80 MG tablet Take 80 mg by mouth at bedtime.   Yes [provider]  busPIRone (BUSPAR) 10 MG tablet Take 10 mg by mouth daily. Patient taking differently: Take 10 mg by mouth daily. Takes 1/2 tablet at breakfast and 1 tablet at bedtime   Yes [provider]  carvedilol (COREG) 6.25 MG tablet Take 6.25 mg by mouth 2 (two) times daily with a meal.   Yes [provider]  hydrochlorothiazide (HYDRODIURIL) 25 MG tablet Take 25 mg by mouth daily.   Yes [provider]  losartan  (COZAAR) 50 MG tablet Take 50 mg by mouth daily. Patient taking differently: Take 100 mg by mouth daily.   Yes [provider]  methotrexate (RHEUMATREX) 2.5 MG tablet Take 17.5 mg by mouth once a week. Patient taking differently: Take 2.5 mg by mouth once a week. Takes 8 tablets weekly 10/06/20  Yes [provider]  ezetimibe  (ZETIA ) 10 MG tablet Take 1 tablet (10 mg total) by mouth daily. 12/27/22 03/27/23  Rana Lum CROME, NP    Allergies as of 10/12/2023 - Review Complete 12/19/2022  Allergen Reaction Noted   Sulfa antibiotics Nausea And Vomiting 02/08/2015    Family History  Problem Relation Age of Onset   Blindness Mother    Diabetes Mother    Heart failure Mother    Heart attack Father    Breast cancer Neg Hx     Social History   Socioeconomic History   Marital status: Single    Spouse name: Not on file   Number of children: Not on file   Years of education: Not on file   Highest education level: Not on file  Occupational History   Not on file  Tobacco Use   Smoking status: Never   Smokeless tobacco: Never  Vaping Use   Vaping status: Never Used  Substance and Sexual Activity   Alcohol use: Yes    Comment: occasional wine-a few glasses per year or one mixed drink per year  Drug use: Never   Sexual activity: Not on file  Other Topics Concern   Not on file  Social History Narrative   Not on file   Social Drivers of Health   Financial Resource Strain: Low Risk  (08/21/2023)   Received from [facility]   Overall Financial Resource Strain (CARDIA)    Difficulty of Paying Living Expenses: Not hard at all  Food Insecurity: No Food Insecurity (08/21/2023)   Received from [facility]   Hunger Vital Sign    Within the past 12 months, you worried that your food would run out before you got the money to buy more.: Never true    Within the past 12 months, the food you bought just didn't last and you didn't have  money to get more.: Never true  Transportation Needs: No Transportation Needs (08/21/2023)   Received from [facility] - Transportation    In the past 12 months, has lack of transportation kept you from medical appointments or from getting medications?: No    Lack of Transportation (Non-Medical): No  Physical Activity: Not on file  Stress: Not on file  Social Connections: Not on file  Intimate Partner Violence: Not on file    Review of Systems: See HPI, otherwise negative ROS  Physical Exam: BP (!) 148/51   Temp (!) 97.5 F (36.4 C) (Temporal)   Resp 18   Ht 4' 9.99 (1.473 m)   Wt 49.2 kg   SpO2 100%   BMI 22.68 kg/m  General:   Alert, cooperative. Head:  Normocephalic and atraumatic. Respiratory:  Normal work of breathing. Cardiovascular:  NAD  Impression/Plan: Kerry Jordan is here for cataract surgery.  Risks, benefits, limitations, and alternatives regarding cataract surgery have been reviewed with the patient.  Questions have been answered.  All parties agreeable.   Adine Novak, MD  11/27/2023, 9:42 AM

## 2023-11-27 NOTE — Op Note (Signed)
 OPERATIVE NOTE  Kerry Jordan [id number] 11/27/2023   PREOPERATIVE DIAGNOSIS:  Nuclear sclerotic cataract right eye.  H25.11   POSTOPERATIVE DIAGNOSIS:    Nuclear sclerotic cataract right eye.     PROCEDURE:  Phacoemusification with posterior chamber intraocular lens placement of the right eye   LENS:   Implant Name Type Inv. Item Serial No. Manufacturer Lot No. LRB No. Used Action  LENS IOL PANO PRO TORC 21.5 - [id number]  LENS IOL PANO PRO TORC 21.5 [id number] [employer]  Right 1 Implanted       Procedure(s): PHACOEMULSIFICATION, CATARACT, WITH IOL INSERTION 7.28 00:50.4 (Right)  SURGEON:  Adine Novak, MD, MPH  ANESTHESIOLOGIST: Anesthesiologist: Ola Donny BROCKS, MD CRNA: Jahoo, Sonia, CRNA   ANESTHESIA:  Topical with tetracaine  drops augmented with 1% preservative-free intracameral lidocaine .  ESTIMATED BLOOD LOSS: less than 1 mL.   COMPLICATIONS:  None.   DESCRIPTION OF PROCEDURE:  The patient was identified in the holding room and transported to the operating room and placed in the supine position under the operating microscope.  The right eye was identified as the operative eye and it was prepped and draped in the usual sterile ophthalmic fashion.  The verion system was registered without difficulty.   A 1.0 millimeter clear-corneal paracentesis was made at the 10:30 position. 0.5 ml of preservative-free 1% lidocaine  with epinephrine  was injected into the anterior chamber.  The anterior chamber was filled with viscoelastic.  A 2.4 millimeter keratome was used to make a near-clear corneal incision at the 8:00 position.  A curvilinear capsulorrhexis was made with a cystotome and capsulorrhexis forceps.  Balanced salt solution was used to hydrodissect and hydrodelineate the nucleus.   Phacoemulsification was then used in stop and chop fashion to remove the lens nucleus and epinucleus.  The remaining cortex was then removed using the irrigation and aspiration  handpiece. Viscoelastic was then placed into the capsular bag to distend it for lens placement.  A lens was then injected into the capsular bag.  The remaining viscoelastic was aspirated.  The lens was rotated with guidance from the verion system.   Wounds were hydrated with balanced salt solution.  The anterior chamber was inflated to a physiologic pressure with balanced salt solution. The lens was well centered.  Intracameral vigamox  0.1 mL undiluted was injected into the eye and a drop placed onto the ocular surface.  No wound leaks were noted.  The patient was taken to the recovery room in stable condition without complications of anesthesia or surgery  Adine Novak 11/27/2023, 10:11 AM

## 2023-11-27 NOTE — Transfer of Care (Signed)
 Immediate Anesthesia Transfer of Care Note  Patient: Kerry Jordan  Procedure(s) Performed: PHACOEMULSIFICATION, CATARACT, WITH IOL INSERTION 7.28 00:50.4 (Right: Eye)  Patient Location: PACU  Anesthesia Type: MAC  Level of Consciousness: awake, alert  and patient cooperative  Airway and Oxygen Therapy: Patient Spontanous Breathing and Patient connected to supplemental oxygen  Post-op Assessment: Post-op Vital signs reviewed, Patient's Cardiovascular Status Stable, Respiratory Function Stable, Patent Airway and No signs of Nausea or vomiting  Post-op Vital Signs: Reviewed and stable  Complications: No notable events documented.

## 2023-11-27 NOTE — Anesthesia Postprocedure Evaluation (Signed)
 Anesthesia Post Note  Patient: Kerry Jordan  Procedure(s) Performed: PHACOEMULSIFICATION, CATARACT, WITH IOL INSERTION 7.28 00:50.4 (Right: Eye)  Patient location during evaluation: PACU Anesthesia Type: MAC Level of consciousness: awake and alert Pain management: pain level controlled Vital Signs Assessment: post-procedure vital signs reviewed and stable Respiratory status: spontaneous breathing, nonlabored ventilation, respiratory function stable and patient connected to nasal cannula oxygen Cardiovascular status: stable and blood pressure returned to baseline Postop Assessment: no apparent nausea or vomiting Anesthetic complications: no   No notable events documented.   Last Vitals:  Vitals:   11/27/23 1012 11/27/23 1015  BP:  135/64  Pulse: 63 65  Resp: 11 (!) 22  Temp: 36.7 C   SpO2: 94% 97%    Last Pain:  Vitals:   11/27/23 1015  TempSrc:   PainSc: 0-No pain                 Maribella Kuna C Yoshiharu Brassell

## 2023-11-29 ENCOUNTER — Other Ambulatory Visit (INDEPENDENT_AMBULATORY_CARE_PROVIDER_SITE_OTHER): Payer: Self-pay | Admitting: Nurse Practitioner

## 2023-11-29 DIAGNOSIS — I6523 Occlusion and stenosis of bilateral carotid arteries: Secondary | ICD-10-CM

## 2023-12-07 ENCOUNTER — Ambulatory Visit (INDEPENDENT_AMBULATORY_CARE_PROVIDER_SITE_OTHER): Payer: Medicare PPO | Admitting: Nurse Practitioner

## 2023-12-07 ENCOUNTER — Encounter (INDEPENDENT_AMBULATORY_CARE_PROVIDER_SITE_OTHER): Payer: Medicare PPO

## 2023-12-08 ENCOUNTER — Encounter (INDEPENDENT_AMBULATORY_CARE_PROVIDER_SITE_OTHER): Payer: Medicare PPO

## 2023-12-08 ENCOUNTER — Ambulatory Visit (INDEPENDENT_AMBULATORY_CARE_PROVIDER_SITE_OTHER): Payer: Medicare PPO | Admitting: Nurse Practitioner

## 2023-12-13 ENCOUNTER — Ambulatory Visit (INDEPENDENT_AMBULATORY_CARE_PROVIDER_SITE_OTHER): Admitting: Nurse Practitioner

## 2023-12-13 ENCOUNTER — Encounter (INDEPENDENT_AMBULATORY_CARE_PROVIDER_SITE_OTHER): Payer: Self-pay | Admitting: Nurse Practitioner

## 2023-12-13 ENCOUNTER — Other Ambulatory Visit (INDEPENDENT_AMBULATORY_CARE_PROVIDER_SITE_OTHER)

## 2023-12-13 VITALS — BP 142/66 | HR 56 | Resp 18 | Wt 111.0 lb

## 2023-12-13 DIAGNOSIS — I6523 Occlusion and stenosis of bilateral carotid arteries: Secondary | ICD-10-CM | POA: Diagnosis not present

## 2023-12-18 ENCOUNTER — Encounter (INDEPENDENT_AMBULATORY_CARE_PROVIDER_SITE_OTHER): Payer: Self-pay | Admitting: Nurse Practitioner

## 2023-12-18 NOTE — Progress Notes (Signed)
 Subjective:    Patient ID: Kerry Jordan, female    DOB: 04/13/1945, 78 y.o.   MRN: [id number] Chief Complaint  Patient presents with   Follow-up    70yr carotid follow up    HPI  Discussed the use of AI scribe software for clinical note transcription with the patient, who gave verbal consent to proceed.  History of Present Illness Kerry Jordan is a 78 year old female with carotid artery disease who presents for a vascular surgery follow-up.  She has a history of carotid artery disease with previous assessments showing 1-39% stenosis. She is currently taking aspirin  and a statin.  She recently underwent cataract surgery, with the right eye operated on during Thanksgiving week. She reports significant improvement in her vision, stating she can now read small print without glasses and that colors appear more vivid. She was initially nervous about the surgery but is now very pleased with the outcome.    Results RADIOLOGY Carotid ultrasound: 1-39% stenosis bilaterally   Review of Systems  Psychiatric/Behavioral:  Positive for confusion.   All other systems reviewed and are negative.      Objective:   Physical Exam Vitals reviewed.  HENT:     Head: Normocephalic.  Neck:     Vascular: No carotid bruit.  Cardiovascular:     Rate and Rhythm: Normal rate.     Pulses: Normal pulses.  Pulmonary:     Effort: Pulmonary effort is normal.  Skin:    General: Skin is warm and dry.  Neurological:     Mental Status: She is alert and oriented to person, place, and time.  Psychiatric:        Mood and Affect: Mood normal.        Behavior: Behavior normal.        Thought Content: Thought content normal.        Judgment: Judgment normal.     Physical Exam    BP (!) 142/66 (BP Location: Left Arm)   Pulse (!) 56   Resp 18   Wt 111 lb (50.3 kg)   BMI 23.21 kg/m   Past Medical History:  Diagnosis Date   Age related osteoporosis    Anxiety    sees counselor for  this   Arthritis    rheumatoid in the hands   Bilateral carotid artery stenosis    Diabetes mellitus without complication (HCC)    type 2 diet controlled   Grade II diastolic dysfunction    Hypercholesteremia    Hypertension    Psoriasis    Rheumatoid arthritis involving multiple sites with positive rheumatoid factor (HCC)    Spongiotic dermatitis    Vaso vagal episode    goes totally unresponsive when she has them    Social History   Socioeconomic History   Marital status: Single    Spouse name: Not on file   Number of children: Not on file   Years of education: Not on file   Highest education level: Not on file  Occupational History   Not on file  Tobacco Use   Smoking status: Never   Smokeless tobacco: Never  Vaping Use   Vaping status: Never Used  Substance and Sexual Activity   Alcohol use: Yes    Comment: occasional wine-a few glasses per year or one mixed drink per year   Drug use: Never   Sexual activity: Not on file  Other Topics Concern   Not on file  Social History Narrative  Not on file   Social Drivers of Health   Tobacco Use: Low Risk (12/13/2023)   Patient History    Smoking Tobacco Use: Never    Smokeless Tobacco Use: Never    Passive Exposure: Not on file  Financial Resource Strain: Low Risk  (08/21/2023)   Received from [facility]   Overall Financial Resource Strain (CARDIA)    Difficulty of Paying Living Expenses: Not hard at all  Food Insecurity: No Food Insecurity (08/21/2023)   Received from [facility]   Epic    Within the past 12 months, you worried that your food would run out before you got the money to buy more.: Never true    Within the past 12 months, the food you bought just didn't last and you didn't have money to get more.: Never true  Transportation Needs: No Transportation Needs (08/21/2023)   Received from [facility] - Transportation    In the past 12 months,  has lack of transportation kept you from medical appointments or from getting medications?: No    Lack of Transportation (Non-Medical): No  Physical Activity: Not on file  Stress: Not on file  Social Connections: Not on file  Intimate Partner Violence: Not on file  Depression (EYV7-0): Not on file  Alcohol Screen: Not on file  Housing: Low Risk  (08/21/2023)   Received from [facility]   Epic    In the last 12 months, was there a time when you were not able to pay the mortgage or rent on time?: No    In the past 12 months, how many times have you moved where you were living?: 0    At any time in the past 12 months, were you homeless or living in a shelter (including now)?: No  Utilities: Not At Risk (08/21/2023)   Received from [facility]   Epic    In the past 12 months has the electric, gas, oil, or water company threatened to shut off services in your home?: No  Health Literacy: Not on file    Past Surgical History:  Procedure Laterality Date   BREAST BIOPSY Left 01/03/12   us  bx/clip-neg   CATARACT EXTRACTION W/PHACO Left 11/13/2023   Procedure: PHACOEMULSIFICATION, CATARACT, WITH IOL INSERTION 6.56 00:44.8;  Surgeon: Myrna Adine Anes, MD;  Location: [facility];  Service: Ophthalmology;  Laterality: Left;   CATARACT EXTRACTION W/PHACO Right 11/27/2023   Procedure: PHACOEMULSIFICATION, CATARACT, WITH IOL INSERTION 7.28 00:50.4;  Surgeon: Myrna Adine Anes, MD;  Location: [facility];  Service: Ophthalmology;  Laterality: Right;   COLONOSCOPY WITH PROPOFOL  N/A 03/27/2017   Procedure: COLONOSCOPY WITH PROPOFOL ;  Surgeon: Gaylyn Gladis PENNER, MD;  Location: [facility];  Service: Endoscopy;  Laterality: N/A;   FOOT SURGERY      Family History  Problem Relation Age of Onset   Blindness Mother    Diabetes Mother    Heart failure Mother    Heart attack Father    Breast cancer Neg Hx     Allergies[1]     Latest Ref Rng &  Units 11/20/2019    2:12 PM 05/17/2016    2:27 AM 02/08/2015    1:50 PM  CBC  WBC 4.0 - 10.5 K/uL 10.0  9.3  8.3   Hemoglobin 12.0 - 15.0 g/dL 85.4  85.6  85.5   Hematocrit 36.0 - 46.0 % 40.6  41.8  42.1   Platelets  150 - 400 K/uL 338  230  241       CMP     Component Value Date/Time   NA 132 (L) 11/20/2019 1412   K 3.1 (L) 11/20/2019 1412   CL 98 11/20/2019 1412   CO2 18 (L) 11/20/2019 1412   GLUCOSE 138 (H) 11/20/2019 1412   BUN 29 (H) 11/20/2019 1412   CREATININE 0.56 11/20/2019 1412   CALCIUM 9.0 11/20/2019 1412   PROT 6.7 12/19/2022 1210   ALBUMIN 4.1 12/19/2022 1210   AST 12 12/19/2022 1210   ALT 13 12/19/2022 1210   ALKPHOS 69 12/19/2022 1210   BILITOT 0.4 12/19/2022 1210   GFRNONAA >60 11/20/2019 1412     No results found.     Assessment & Plan:   1. Bilateral carotid artery stenosis (Primary) Carotid atherosclerosis Well-managed with minimal plaque and no significant progression. Surgical intervention unlikely unless symptoms or condition change. - Continue aspirin  and atorvastatin. - Schedule follow-up every two years unless symptoms suggestive of stroke or transient ischemic attack occur. - Advised immediate emergency evaluation if stroke-like symptoms occur     Medications Ordered Prior to Encounter[2]  There are no Patient Instructions on file for this visit. No follow-ups on file.   Kelsye Loomer E Mercedees Convery, NP      [1]  Allergies Allergen Reactions   Sulfa Antibiotics Nausea And Vomiting  [2]  Current Outpatient Medications on File Prior to Visit  Medication Sig Dispense Refill   atorvastatin (LIPITOR) 80 MG tablet Take 80 mg by mouth at bedtime.     busPIRone (BUSPAR) 10 MG tablet Take 10 mg by mouth daily. (Patient taking differently: Take 10 mg by mouth daily. Takes 1/2 tablet at breakfast and 1 tablet at bedtime)     carvedilol (COREG) 6.25 MG tablet Take 6.25 mg by mouth 2 (two) times daily with a meal.     ezetimibe  (ZETIA ) 10 MG tablet  Take 1 tablet (10 mg total) by mouth daily. 90 tablet 3   hydrochlorothiazide (HYDRODIURIL) 25 MG tablet Take 25 mg by mouth daily.     losartan (COZAAR) 50 MG tablet Take 50 mg by mouth daily. (Patient taking differently: Take 100 mg by mouth daily.)     methotrexate (RHEUMATREX) 2.5 MG tablet Take 17.5 mg by mouth once a week. (Patient taking differently: Take 2.5 mg by mouth once a week. Takes 8 tablets weekly)     No current facility-administered medications on file prior to visit.

## 2025-12-12 ENCOUNTER — Ambulatory Visit (INDEPENDENT_AMBULATORY_CARE_PROVIDER_SITE_OTHER): Admitting: Nurse Practitioner

## 2025-12-12 ENCOUNTER — Encounter (INDEPENDENT_AMBULATORY_CARE_PROVIDER_SITE_OTHER)
